# Patient Record
Sex: Female | Born: 1955 | Race: Black or African American | Hispanic: No | Marital: Single | Smoking: Never smoker
Health system: Southern US, Community
[De-identification: ages and names within clinical notes are randomized; demographics above are authoritative.]

## PROBLEM LIST (undated history)

## (undated) DIAGNOSIS — E039 Hypothyroidism, unspecified: Secondary | ICD-10-CM

## (undated) DIAGNOSIS — D869 Sarcoidosis, unspecified: Secondary | ICD-10-CM

## (undated) DIAGNOSIS — E785 Hyperlipidemia, unspecified: Secondary | ICD-10-CM

## (undated) DIAGNOSIS — F411 Generalized anxiety disorder: Secondary | ICD-10-CM

## (undated) DIAGNOSIS — F419 Anxiety disorder, unspecified: Secondary | ICD-10-CM

## (undated) DIAGNOSIS — I10 Essential (primary) hypertension: Secondary | ICD-10-CM

## (undated) DIAGNOSIS — M8580 Other specified disorders of bone density and structure, unspecified site: Secondary | ICD-10-CM

## (undated) DIAGNOSIS — G43909 Migraine, unspecified, not intractable, without status migrainosus: Secondary | ICD-10-CM

## (undated) DIAGNOSIS — J3089 Other allergic rhinitis: Secondary | ICD-10-CM

## (undated) HISTORY — DX: Other specified disorders of bone density and structure, unspecified site: M85.80

## (undated) HISTORY — DX: Other allergic rhinitis: J30.89

## (undated) HISTORY — DX: Hyperlipidemia, unspecified: E78.5

## (undated) HISTORY — DX: Essential (primary) hypertension: I10

## (undated) HISTORY — DX: Hypothyroidism, unspecified: E03.9

## (undated) HISTORY — DX: Anxiety disorder, unspecified: F41.9

## (undated) HISTORY — PX: THYROID SURGERY: SHX805

## (undated) HISTORY — DX: Migraine, unspecified, not intractable, without status migrainosus: G43.909

## (undated) HISTORY — DX: Sarcoidosis, unspecified: D86.9

## (undated) HISTORY — DX: Generalized anxiety disorder: F41.1

---
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
## 1985-08-24 HISTORY — PX: TUBAL LIGATION: SHX77

## 1999-01-07 ENCOUNTER — Other Ambulatory Visit: Admission: RE | Admit: 1999-01-07 | Discharge: 1999-01-07 | Payer: Self-pay | Admitting: Obstetrics and Gynecology

## 2000-05-05 ENCOUNTER — Other Ambulatory Visit: Admission: RE | Admit: 2000-05-05 | Discharge: 2000-05-05 | Payer: Self-pay | Admitting: Obstetrics & Gynecology

## 2000-05-18 ENCOUNTER — Ambulatory Visit (HOSPITAL_BASED_OUTPATIENT_CLINIC_OR_DEPARTMENT_OTHER): Admission: RE | Admit: 2000-05-18 | Discharge: 2000-05-18 | Payer: Self-pay | Admitting: Orthopedic Surgery

## 2001-08-03 ENCOUNTER — Other Ambulatory Visit: Admission: RE | Admit: 2001-08-03 | Discharge: 2001-08-03 | Payer: Self-pay | Admitting: Obstetrics & Gynecology

## 2002-07-28 ENCOUNTER — Other Ambulatory Visit: Admission: RE | Admit: 2002-07-28 | Discharge: 2002-07-28 | Payer: Self-pay | Admitting: Obstetrics and Gynecology

## 2003-08-13 ENCOUNTER — Encounter: Admission: RE | Admit: 2003-08-13 | Discharge: 2003-08-13 | Payer: Self-pay | Admitting: Dermatology

## 2003-09-21 ENCOUNTER — Encounter: Admission: RE | Admit: 2003-09-21 | Discharge: 2003-09-21 | Payer: Self-pay | Admitting: Internal Medicine

## 2003-11-13 ENCOUNTER — Ambulatory Visit (HOSPITAL_COMMUNITY): Admission: RE | Admit: 2003-11-13 | Discharge: 2003-11-13 | Payer: Self-pay | Admitting: Internal Medicine

## 2004-03-17 ENCOUNTER — Encounter: Admission: RE | Admit: 2004-03-17 | Discharge: 2004-03-17 | Payer: Self-pay | Admitting: Internal Medicine

## 2004-06-10 ENCOUNTER — Ambulatory Visit (HOSPITAL_COMMUNITY): Admission: RE | Admit: 2004-06-10 | Discharge: 2004-06-10 | Payer: Self-pay | Admitting: Internal Medicine

## 2004-11-24 ENCOUNTER — Ambulatory Visit: Payer: Self-pay | Admitting: Internal Medicine

## 2004-12-01 ENCOUNTER — Emergency Department (HOSPITAL_COMMUNITY): Admission: EM | Admit: 2004-12-01 | Discharge: 2004-12-01 | Payer: Self-pay | Admitting: Family Medicine

## 2004-12-22 ENCOUNTER — Ambulatory Visit: Payer: Self-pay | Admitting: Internal Medicine

## 2005-02-16 ENCOUNTER — Ambulatory Visit: Payer: Self-pay | Admitting: Internal Medicine

## 2005-05-19 ENCOUNTER — Ambulatory Visit: Payer: Self-pay | Admitting: Internal Medicine

## 2006-01-06 ENCOUNTER — Emergency Department (HOSPITAL_COMMUNITY): Admission: EM | Admit: 2006-01-06 | Discharge: 2006-01-06 | Payer: Self-pay | Admitting: Family Medicine

## 2006-04-14 ENCOUNTER — Ambulatory Visit: Payer: Self-pay | Admitting: Internal Medicine

## 2006-12-11 ENCOUNTER — Emergency Department (HOSPITAL_COMMUNITY): Admission: EM | Admit: 2006-12-11 | Discharge: 2006-12-11 | Payer: Self-pay | Admitting: Emergency Medicine

## 2007-07-30 ENCOUNTER — Emergency Department (HOSPITAL_COMMUNITY): Admission: EM | Admit: 2007-07-30 | Discharge: 2007-07-30 | Payer: Self-pay | Admitting: Family Medicine

## 2008-01-01 ENCOUNTER — Emergency Department (HOSPITAL_COMMUNITY): Admission: EM | Admit: 2008-01-01 | Discharge: 2008-01-01 | Payer: Self-pay | Admitting: Family Medicine

## 2008-01-27 ENCOUNTER — Encounter: Payer: Self-pay | Admitting: Internal Medicine

## 2008-03-01 ENCOUNTER — Encounter: Payer: Self-pay | Admitting: Internal Medicine

## 2008-03-08 ENCOUNTER — Encounter: Payer: Self-pay | Admitting: Internal Medicine

## 2008-03-14 DIAGNOSIS — J302 Other seasonal allergic rhinitis: Secondary | ICD-10-CM

## 2008-03-14 DIAGNOSIS — J3089 Other allergic rhinitis: Secondary | ICD-10-CM

## 2008-03-14 DIAGNOSIS — D869 Sarcoidosis, unspecified: Secondary | ICD-10-CM

## 2008-03-14 HISTORY — DX: Other seasonal allergic rhinitis: J30.2

## 2008-03-15 ENCOUNTER — Ambulatory Visit: Payer: Self-pay | Admitting: Internal Medicine

## 2008-03-15 DIAGNOSIS — M7918 Myalgia, other site: Secondary | ICD-10-CM

## 2008-03-15 DIAGNOSIS — R29818 Other symptoms and signs involving the nervous system: Secondary | ICD-10-CM | POA: Insufficient documentation

## 2008-03-15 HISTORY — DX: Myalgia, other site: M79.18

## 2008-03-15 LAB — CONVERTED CEMR LAB: Angiotensin 1 Converting Enzyme: 18 units/L (ref 9–67)

## 2008-04-17 ENCOUNTER — Ambulatory Visit: Payer: Self-pay | Admitting: Internal Medicine

## 2008-09-13 ENCOUNTER — Telehealth: Payer: Self-pay | Admitting: Internal Medicine

## 2008-11-19 ENCOUNTER — Ambulatory Visit: Payer: Self-pay | Admitting: Internal Medicine

## 2009-02-05 ENCOUNTER — Telehealth (INDEPENDENT_AMBULATORY_CARE_PROVIDER_SITE_OTHER): Payer: Self-pay | Admitting: *Deleted

## 2010-06-22 ENCOUNTER — Emergency Department (HOSPITAL_COMMUNITY): Admission: EM | Admit: 2010-06-22 | Discharge: 2010-06-22 | Payer: Self-pay | Admitting: Family Medicine

## 2010-06-27 ENCOUNTER — Telehealth: Payer: Self-pay | Admitting: Internal Medicine

## 2010-09-23 NOTE — Progress Notes (Signed)
Summary: sick-requesting anitbiotic  Phone Note Call from Patient Call back at Work Phone [phone]   Caller: Patient Call For: young Reason for Call: Acute Illness, Talk to Nurse Summary of Call: Patient called with c/o horseness, tightness in chest, non-productive cough that is worse at night, and increased sob when coughing.  Pt states symptoms started 1 week ago.  [facility] [place]-- [phone] Initial call taken by: Lehman Prom,  June 27, 2010 8:31 AM  Follow-up for Phone Call        called spoke with patient who c/o tightness in chest, dry cough mostly at ngiht, some PND w/ horseness and increased SOB x1 week.  denies f/c/s.  states is going to [place] on the 14th.  requesting abx.  last ov 3.2010, no upcoming appts.  will sched pt for follow up. nkda.  [facility] [place] Follow-up by: Boone Master CNA/MA,  June 27, 2010 9:20 AM  Additional Follow-up for Phone Call Additional follow up Details #1::        Per CDY- give patient Zpak #1 take as directed no refills.Reynaldo Minium CMA  June 27, 2010 10:51 AM   [facility] [place] pt aware.Carron Curie CMA  June 27, 2010 11:04 AM     Prescriptions: ZITHROMAX Z-PAK 250 MG TABS (AZITHROMYCIN) 2 today then one daily  #1 pak x 0   Entered by:   Carron Curie CMA   Authorized by:   Waymon Budge MD   Signed by:   Carron Curie CMA on 06/27/2010   Method used:   Historical   RxID:   [id number]

## 2011-01-09 NOTE — Assessment & Plan Note (Signed)
[facility]                               PULMONARY OFFICE NOTE   Mary Taylor, Mary Taylor                       MRN:          [id number]  DATE:04/14/2006                            DOB:          1955/12/16    PROBLEM LIST:  1. Sarcoidosis, lung and skin.  2. Perennial rhinitis.   HISTORY:  One year followup.  She says she is doing very well.  She denies  rash, adenopathy, fever, significant joint pain, chest complaints or any  acute change.  She walks regularly in her neighborhood with no change in  exercise tolerance.  She continues to notice occasional nasal congestion,  but without itch or sneeze.  Rhinocort worked for this, but Hydrologist  did not.   MEDICATIONS:  Synthroid, nasal steroid inhaler when available, occasional  Xanax p.r.n.Marland Kitchen   ALLERGIES:  No medication allergies.   OBJECTIVE:  VITAL SIGNS:  Weight 156 pounds.  BP 138/80.  Pulse regular and  76.  Room air saturation 100%.  GENERAL  This is a slender woman, looks comfortable.  NECK:  I find no adenopathy.  CARDIAC:  Heart sounds are regular without murmur or gallop.  EYES:  Conjunctivae are clear.  NOSE:  Nasal mucosa is mildly boggy without postnasal drainage.  THROAT:  There is no stridor.  Voice quality is normal.  LUNGS:  Clear to P&A.  ABDOMEN:  I do not feel enlargement of liver or spleen.  There is no sign of  cyst or edema.   IMPRESSION:  Stable sarcoid, probably inactive.  Perennial rhinitis, which  improves with regular use of a nasal steroid inhaler.   PLAN:  1. Try sample prescription of Nasonex 1 to each nostril daily.  2. Chest x-ray.  3. Schedule return 1-year followup, earlier p.r.n.Joni Fears D. Maple Hudson, MD, [facility], FACP   CDY/MedQ  DD:  04/14/2006  DT:  04/15/2006  Job #:  [id number]   cc:   Darius Bump, MD

## 2011-01-09 NOTE — Op Note (Signed)
[place]. [facility]  Patient:    Mary Taylor, Mary Taylor                       MRN: [id number] Proc. Date: 05/18/00 Adm. Date:  [id number] Attending:  Susa Day                           Operative Report  PREOPERATIVE DIAGNOSIS:  Interarticular fracture left small finger, middle phalanx, at the interphalangeal joint.  POSTOPERATIVE DIAGNOSIS: Interarticular fracture left small finger, middle phalanx, at the interphalangeal joint.  PROCEDURE:  Closed reduction and 0.025 inch Kirschner wire fixation of radial condyle fracture of left small finger, middle phalanx fracture, at distal interphalangeal joint.  SURGEON:  Katy Fitch. Sypher, Montez Hageman., M.D.  ASSISTANT:  None.  ANESTHESIA:  0.25% Marcaine with 2% lidocaine metacarpal head level block of left small finger supplemented by IV sedation.  ANESTHESIOLOGIST:  Halford Decamp, M.D.  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  She had immediate pain, swelling, and deformity.  She was seen in urgent medical care center by Dr. Merla Riches.  Dr. Merla Riches obtained a x-ray which revealed a condylar fracture of the middle phalanx at the DIP joint.  He splinted her and referred her for hand surgery consult.  She was seen for consultation on May 17, 2000, and was noted to have an interarticular fracture.  She had a radial deviation deformity of her DIP joint and could not move the joint due to pain.  She was advised to undergo closed reduction due to the interarticular nature of the fracture and percutaneous Kirschner wire fixation.  PROCEDURE IN DETAIL:   After informed consent she was brought to the operating room at this time.  Daquana Paddock was brought to the operating room and placed in supine position on  the operating room table.  Following light sedation the left arm was prepped with Betadine soap solution, and sterilely draped.  Then 0.25% Marcaine and 2% lidocaine were infiltrated at the metacarpal head level to obtain a digital block.  When anesthesia was satisfactory the fracture was reduced anatomically with ulnar deviation and digital compression.  A rubber-shod hemostat was used to hold this followed by placement of two 0.035 inch Kirschner wires through the main fragment into the fragment from a ulnar approach.  An anatomic reduction was achieved and good position of the pins was obtained.  C-arm fluoroscopic images were documented with digital photography followed by dressing the wound with collodion to seal the pin tracks and applying a aluminum splint to the finger.  There were no apparent complications.  Mary Taylor tolerated the surgery and anesthesia well.  She was transferred to the recovery room with stable vital signs.  For her aftercare she will be given a prescription for Percocet 5/325 1-2 tablets p.o. q.4-6h. p.r.n. pain #20 tablets without refill.  She will return to my office for follow in 1 week for check x-ray.  She and her husband were advised to elevate the hand for 48 hours. DD:  05/18/00 TD:  05/19/00 Job: [id number]

## 2011-06-01 LAB — URINE CULTURE
Colony Count: NO GROWTH
Culture: NO GROWTH

## 2011-06-01 LAB — POCT URINALYSIS DIP (DEVICE)
Glucose, UA: NEGATIVE
Nitrite: POSITIVE — AB
Operator id: 239701
Protein, ur: 100 — AB
Specific Gravity, Urine: 1.015
Urobilinogen, UA: 1
pH: 7

## 2012-07-07 ENCOUNTER — Other Ambulatory Visit: Payer: Self-pay | Admitting: Family Medicine

## 2012-07-07 DIAGNOSIS — E010 Iodine-deficiency related diffuse (endemic) goiter: Secondary | ICD-10-CM

## 2012-07-20 ENCOUNTER — Ambulatory Visit
Admission: RE | Admit: 2012-07-20 | Discharge: 2012-07-20 | Disposition: A | Discharge status: Home / Self Care | Payer: 59 | Source: Ambulatory Visit | Attending: Family Medicine | Admitting: Family Medicine

## 2012-07-20 DIAGNOSIS — E010 Iodine-deficiency related diffuse (endemic) goiter: Secondary | ICD-10-CM

## 2012-08-23 ENCOUNTER — Institutional Professional Consult (permissible substitution): Payer: Self-pay | Admitting: Internal Medicine

## 2012-10-10 ENCOUNTER — Ambulatory Visit (INDEPENDENT_AMBULATORY_CARE_PROVIDER_SITE_OTHER)
Admission: RE | Admit: 2012-10-10 | Discharge: 2012-10-10 | Disposition: A | Discharge status: Home / Self Care | Payer: BC Managed Care – PPO | Source: Ambulatory Visit | Attending: Internal Medicine | Admitting: Internal Medicine

## 2012-10-10 ENCOUNTER — Other Ambulatory Visit: Payer: BC Managed Care – PPO

## 2012-10-10 ENCOUNTER — Ambulatory Visit (INDEPENDENT_AMBULATORY_CARE_PROVIDER_SITE_OTHER): Payer: BC Managed Care – PPO | Admitting: Internal Medicine

## 2012-10-10 ENCOUNTER — Encounter: Payer: Self-pay | Admitting: Internal Medicine

## 2012-10-10 VITALS — BP 138/78 | HR 73 | Ht 67.0 in | Wt 147.2 lb

## 2012-10-10 DIAGNOSIS — J302 Other seasonal allergic rhinitis: Secondary | ICD-10-CM

## 2012-10-10 DIAGNOSIS — D869 Sarcoidosis, unspecified: Secondary | ICD-10-CM

## 2012-10-10 DIAGNOSIS — J309 Allergic rhinitis, unspecified: Secondary | ICD-10-CM

## 2012-10-10 NOTE — Progress Notes (Signed)
10/10/12- 57 yoF never smoker, former patient-Self referral-allergic rhinitis and sarcoidosis; last seen 11-19-08 She decided it was time to come back for a checkup, admitting that her brother just died last week of hypoxic respiratory failure caused by sarcoid. She chose not to get flu shot this year. History of sarcoid diagnosed with a biopsy from her knee years ago. Went to urgent care for recent cough with laryngitis. Was treated with steroid shot and antibiotic with benefit. History of allergic rhinitis with no routine cough or wheeze. No routine fever, nodes, night sweats, rash or cough.  Divorced mother of a 58 year old, working as a Armed forces training and education officer.  Prior to Admission medications   Medication Sig Start Date End Date Taking? Authorizing Provider  cholecalciferol (VITAMIN D) 1000 UNITS tablet Take 1,000 Units by mouth daily.   Yes Historical Provider, MD  lisinopril (PRINIVIL,ZESTRIL) 10 MG tablet Take 1 tablet by mouth daily. 07/09/12  Yes Historical Provider, MD  SYNTHROID 75 MCG tablet Take 1 tablet by mouth daily. 09/27/12  Yes Historical Provider, MD   Past Medical History  Diagnosis Date  . Hyperlipidemia   . Hypertension    Past Surgical History  Procedure Laterality Date  . Tubal ligation  1987  . Breast cyst excision  1970  . Thyroid surgery      goiter   History reviewed. No pertinent family history. History   Social History  . Marital Status: Single    Spouse Name: N/A    Number of Children: N/A  . Years of Education: N/A   Occupational History  . maint. tech    Social History Main Topics  . Smoking status: Never Smoker   . Smokeless tobacco: Not on file  . Alcohol Use: No  . Drug Use: No  . Sexually Active: Not on file   Other Topics Concern  . Not on file   Social History Narrative  . No narrative on file   ROS-see HPI Constitutional:   No-   weight loss, night sweats, fevers, chills, fatigue, lassitude. HEENT:   No-  headaches, difficulty  swallowing, tooth/dental problems, sore throat,       No-  sneezing, itching, ear ache, nasal congestion, post nasal drip,  CV:  No-   chest pain, orthopnea, PND, swelling in lower extremities, anasarca,                                  dizziness, palpitations Resp: No-   shortness of breath with exertion or at rest.              No-   productive cough,  No non-productive cough,  No- coughing up of blood.              No-   change in color of mucus.  No- wheezing.   Skin: No-   rash or lesions. GI:  No-   heartburn, indigestion, abdominal pain, nausea, vomiting, diarrhea,                 change in bowel habits, loss of appetite GU: No-   dysuria, change in color of urine, no urgency or frequency.  No- flank pain. MS:  No-   joint pain or swelling.  No- decreased range of motion.  No- back pain. Neuro-     nothing unusual Psych:  No- change in mood or affect. No depression or anxiety.  No memory loss.  OBJ- Physical  Exam General- Alert, Oriented, Affect-appropriate, Distress- none acute, trim. Skin- rash-none, lesions- none, excoriation- none Lymphadenopathy- none Head- atraumatic            Eyes- Gross vision intact, PERRLA, conjunctivae and secretions clear            Ears- Hearing, canals-normal            Nose- Clear, no-Septal dev, mucus, polyps, erosion, perforation             Throat- Mallampati II , mucosa clear , drainage- none, tonsils- atrophic Neck- flexible , trachea midline, no stridor , thyroid nl, carotid no bruit Chest - symmetrical excursion , unlabored           Heart/CV- RRR , no murmur , no gallop  , no rub, nl s1 s2                           - JVD- none , edema- none, stasis changes- none, varices- none           Lung- clear to P&A, wheeze- none, cough- none , dullness-none, rub- none           Chest wall-  Abd- tender-no, distended-no, bowel sounds-present, HSM- no Br/ Gen/ Rectal- Not done, not indicated Extrem- cyanosis- none, clubbing, none, atrophy- none,  strength- nl Neuro- grossly intact to observation

## 2012-10-10 NOTE — Assessment & Plan Note (Signed)
No major markers of active disease. Hopefully this is in long-term remission. Plan-ACE level, chest x-ray

## 2012-10-10 NOTE — Assessment & Plan Note (Signed)
She has recognized some rhinitis symptoms separate from the recent cold. This has been a complaint in the past but is not bothering her much right now. Consider antihistamines as first line of defense

## 2012-10-10 NOTE — Patient Instructions (Addendum)
Order- CXR   Dx sarcoidosis              Lab- ACE level  Please call as needed

## 2012-10-11 LAB — ANGIOTENSIN CONVERTING ENZYME: Angiotensin-Converting Enzyme: 4 U/L — ABNORMAL LOW (ref 8–52)

## 2012-11-14 ENCOUNTER — Ambulatory Visit: Payer: BC Managed Care – PPO | Admitting: Internal Medicine

## 2014-04-07 ENCOUNTER — Encounter: Payer: Self-pay | Admitting: *Deleted

## 2014-05-11 ENCOUNTER — Telehealth: Payer: Self-pay | Admitting: Internal Medicine

## 2014-05-11 MED ORDER — AZITHROMYCIN 250 MG PO TABS: ORAL_TABLET | ORAL | Status: DC

## 2014-05-11 NOTE — Telephone Encounter (Signed)
Called spoke with patient who c/o head congestion, PND onset Monday 9/14 but beginning Wednesday 9/16 she began having prod cough with dark yellow/green mucus, tightness in chest thru back and hoarseness/larygnitis and increased DOE today.  Pt denies any hemoptysis, f/c/s, n/v/d.  Her last appt with CDY was 2.17.14 but has scheduled an upcoming appt on 9.29.15 with said provider.  Dr Maple Hudson please advise, thank you. [facility] on [address] Allergies  Allergen Reactions  . Ibuprofen     Stomach pains  . Lexapro [Escitalopram Oxalate]     Hoarse   Current Outpatient Prescriptions on File Prior to Visit  Medication Sig Dispense Refill  . ALPRAZolam (XANAX) 0.25 MG tablet Take 0.25 mg by mouth at bedtime as needed for anxiety.      . cholecalciferol (VITAMIN D) 1000 UNITS tablet Take 1,000 Units by mouth daily.      . Cyanocobalamin (VITAMIN B 12 PO) Take by mouth as needed.      . cyclobenzaprine (FLEXERIL) 10 MG tablet Take 10 mg by mouth as needed for muscle spasms.      Marland Kitchen lisinopril (PRINIVIL,ZESTRIL) 10 MG tablet Take 1 tablet by mouth daily.      Marland Kitchen SYNTHROID 75 MCG tablet Take 1 tablet by mouth daily.       No current facility-administered medications on file prior to visit.

## 2014-05-11 NOTE — Telephone Encounter (Signed)
Offer Z pak May help to also get Mucinex-DM

## 2014-05-11 NOTE — Telephone Encounter (Signed)
LMOMTCB x 1 

## 2014-05-11 NOTE — Telephone Encounter (Signed)
Spoke with the pt and notified of recs per CDY  She verbalized understanding and denies any questions  Rx [facility] to [facility]

## 2014-05-22 ENCOUNTER — Ambulatory Visit (INDEPENDENT_AMBULATORY_CARE_PROVIDER_SITE_OTHER): Payer: BC Managed Care – PPO | Admitting: Internal Medicine

## 2014-05-22 ENCOUNTER — Encounter: Payer: Self-pay | Admitting: Internal Medicine

## 2014-05-22 VITALS — BP 122/66 | HR 71 | Resp 16 | Ht 67.0 in | Wt 150.0 lb

## 2014-05-22 DIAGNOSIS — D869 Sarcoidosis, unspecified: Secondary | ICD-10-CM

## 2014-05-22 DIAGNOSIS — J069 Acute upper respiratory infection, unspecified: Secondary | ICD-10-CM

## 2014-05-22 HISTORY — DX: Acute upper respiratory infection, unspecified: J06.9

## 2014-05-22 NOTE — Patient Instructions (Signed)
Chest xray and lab test from last year looked fine, with no evidence of active sarcoid. It is unlikely to flare again.  The recent cold should fade away.   Please call if we can help.

## 2014-05-22 NOTE — Assessment & Plan Note (Signed)
Probably resolving viral URI. Plan- fluids, avoid chilling

## 2014-05-22 NOTE — Assessment & Plan Note (Signed)
Clinically in remission 

## 2014-05-22 NOTE — Progress Notes (Signed)
10/10/12- 28 yoF never smoker, former patient-Self referral-allergic rhinitis and sarcoidosis; last seen 11-19-08 She decided it was time to come back for a checkup, admitting that her brother just died last week of hypoxic respiratory failure caused by sarcoid. She chose not to get flu shot this year. History of sarcoid diagnosed with a biopsy from her knee years ago. Went to urgent care for recent cough with laryngitis. Was treated with steroid shot and antibiotic with benefit. History of allergic rhinitis with no routine cough or wheeze. No routine fever, nodes, night sweats, rash or cough.  Divorced mother of a 44 year old, working as a Armed forces training and education officer.  05/22/14- 50 yoF never smoker, former patient-Self referral-allergic rhinitis and sarcoidosis Had been doing very well. No rash, nodes, cough, chest pain to suggest sarcoid. Acute illness- URI caught at work in last week. Scratchy throat, no fever, had fever blister. We sent Zpak and she is doing much better. ACE 4   10/10/12 CXR 10/10/12 IMPRESSION:  No active disease. Stable probable granulomas right lower lobe.  Original Report Authenticated By: Natasha Mead, M.D.  ROS-see HPI Constitutional:   No-   weight loss, night sweats, fevers, chills, fatigue, lassitude. HEENT:   No-  headaches, difficulty swallowing, tooth/dental problems, sore throat,       No-  sneezing, itching, ear ache, nasal congestion, post nasal drip,  CV:  No-   chest pain, orthopnea, PND, swelling in lower extremities, anasarca,                                  dizziness, palpitations Resp: No-   shortness of breath with exertion or at rest.              No-   productive cough,  N non-productive cough,  No- coughing up of blood.              No-   change in color of mucus.  No- wheezing.   Skin: No-   rash or lesions. GI:  No-   heartburn, indigestion, abdominal pain, nausea, vomiting GU:  MS:  No-   joint pain or swelling.  . Neuro-     nothing unusual Psych:   No- change in mood or affect. No depression or anxiety.  No memory loss.  OBJ- Physical Exam General- Alert, Oriented, Affect-appropriate, Distress- none acute, trim. Skin- rash-none, lesions- none, excoriation- none Lymphadenopathy- none Head- atraumatic            Eyes- Gross vision intact, PERRLA, conjunctivae and secretions clear            Ears- Hearing, canals-normal            Nose- Clear, no-Septal dev, mucus, polyps, erosion, perforation             Throat- Mallampati II , mucosa clear , drainage- none, tonsils- atrophic Neck- flexible , trachea midline, no stridor , thyroid nl, carotid no bruit Chest - symmetrical excursion , unlabored           Heart/CV- RRR , no murmur , no gallop  , no rub, nl s1 s2                           - JVD- none , edema- none, stasis changes- none, varices- none           Lung- clear to P&A, wheeze- none, cough- none ,  dullness-none, rub- none, +light throat                            clearing           Chest wall-  Abd-  Br/ Gen/ Rectal- Not done, not indicated Extrem- cyanosis- none, clubbing, none, atrophy- none, strength- nl Neuro- grossly intact to observation

## 2015-07-24 ENCOUNTER — Telehealth: Payer: Self-pay | Admitting: Internal Medicine

## 2015-07-24 NOTE — Telephone Encounter (Signed)
LMTCB x 1 

## 2015-07-24 NOTE — Telephone Encounter (Signed)
Called and spoke to pt. Pt stated she thinks she is at the beginning of loosing her voice and didn't know if she should come in for an appt or CY should send in an rx. Pt was last seen in 04/2014 and was advised to f/u prn. Advised pt she could call her PCP to get an appt but if there are issues and she cannot get in to see PCP or she worsens then to call us back. Pt was not hoarse at the moment and denied any other symptoms. Pt stated she will call her PCP and call back here if needed. Nothing further needed at this time.

## 2015-07-24 NOTE — Telephone Encounter (Signed)
Patient returned call, may be reached at [phone]

## 2016-01-14 ENCOUNTER — Ambulatory Visit
Admission: RE | Admit: 2016-01-14 | Discharge: 2016-01-14 | Disposition: A | Discharge status: Home / Self Care | Payer: BLUE CROSS/BLUE SHIELD | Source: Ambulatory Visit | Attending: Family Medicine | Admitting: Family Medicine

## 2016-01-14 ENCOUNTER — Other Ambulatory Visit: Payer: Self-pay | Admitting: Family Medicine

## 2016-01-14 DIAGNOSIS — R079 Chest pain, unspecified: Secondary | ICD-10-CM

## 2016-01-14 DIAGNOSIS — R0781 Pleurodynia: Secondary | ICD-10-CM

## 2016-01-16 ENCOUNTER — Telehealth: Payer: Self-pay | Admitting: Internal Medicine

## 2016-01-16 NOTE — Telephone Encounter (Signed)
Appointment date is fine. Thanks.

## 2016-01-21 ENCOUNTER — Ambulatory Visit (INDEPENDENT_AMBULATORY_CARE_PROVIDER_SITE_OTHER): Payer: BLUE CROSS/BLUE SHIELD | Admitting: Adult Health

## 2016-01-21 ENCOUNTER — Other Ambulatory Visit: Payer: BLUE CROSS/BLUE SHIELD

## 2016-01-21 ENCOUNTER — Encounter: Payer: Self-pay | Admitting: Adult Health

## 2016-01-21 VITALS — BP 136/88 | HR 80 | Temp 98.3°F | Ht 67.0 in | Wt 147.0 lb

## 2016-01-21 DIAGNOSIS — D869 Sarcoidosis, unspecified: Secondary | ICD-10-CM

## 2016-01-21 NOTE — Patient Instructions (Signed)
Begin Delsym 2 tsp Twice daily  As needed cough .  May use Zyrtec 10mg  At bedtime  As needed for drainage .  Discuss with your primary MD that Lisinopril may be causing your cough to be worse.  Labs today .  follow up Dr. Maple HudsonYoung  In 6-8 weeks with PFT .  Please contact office for sooner follow up if symptoms do not improve or worsen or seek emergency care

## 2016-01-21 NOTE — Addendum Note (Signed)
Addended by: Karalee HeightOX, Kimia Finan P on: 01/21/2016 05:06 PM   Modules accepted: Orders

## 2016-01-21 NOTE — Progress Notes (Signed)
Subjective:    Patient ID: Mary Taylor, female    DOB: Nov 28, 1955, 60 y.o.   MRN: [id number]  HPI [age] year old female never smoker followed for allergic rhinitis and sarcoidosis with pulmonary and skin involvement. Diagnosis from previous skin lesion.   01/21/2016 follow-up sarcoid  patient returns for follow-up for sarcoid. She was last seen in September 2015. Patient complains over the last 6 weeks that she has had a persistent dry cough. She was seen at urgent care and given a prednisone taper. He did not see any significant improvement. He denies any fever, orthopnea, PND, leg swelling, rash, hemoptysis, weight loss.  Patient was seen by her primary care doctor last week. Chest x-ray showed no significant change in hilar adenopathy. She is not using anything for cough.. Says that she has used Zyrtec for intermittent drainage. But does not feel like at this is a huge problem .  She denies any reflux.     Past Medical History  Diagnosis Date  . Hyperlipidemia   . Hypertension   . Sarcoidosis (HCC)   . Hypothyroidism   . Perennial allergic rhinitis   . Migraine   . Anxiety disorder     situational  . Osteopenia    Current Outpatient Prescriptions on File Prior to Visit  Medication Sig Dispense Refill  . ALPRAZolam (XANAX) 0.25 MG tablet Take 0.25 mg by mouth at bedtime as needed for anxiety.    . cholecalciferol (VITAMIN D) 1000 UNITS tablet Take 1,000 Units by mouth daily.    . Cyanocobalamin (VITAMIN B 12 PO) Take by mouth as needed.    . cyclobenzaprine (FLEXERIL) 10 MG tablet Take 10 mg by mouth as needed for muscle spasms.    Marland Kitchen. lisinopril (PRINIVIL,ZESTRIL) 10 MG tablet Take 1 tablet by mouth daily.    Marland Kitchen. SYNTHROID 75 MCG tablet Take 1 tablet by mouth daily.     No current facility-administered medications on file prior to visit.      Review of Systems Constitutional:   No  weight loss, night sweats,  Fevers, chills, fatigue, or  lassitude.  HEENT:   No  headaches,  Difficulty swallowing,  Tooth/dental problems, or  Sore throat,                No sneezing, itching, ear ache, nasal congestion, post nasal drip,   CV:  No chest pain,  Orthopnea, PND, swelling in lower extremities, anasarca, dizziness, palpitations, syncope.   GI  No heartburn, indigestion, abdominal pain, nausea, vomiting, diarrhea, change in bowel habits, loss of appetite, bloody stools.   Resp: No shortness of breath with exertion or at rest.  No excess mucus, no productive cough,  No non-productive cough,  No coughing up of blood.  No change in color of mucus.  No wheezing.  No chest wall deformity  Skin: no rash or lesions.  GU: no dysuria, change in color of urine, no urgency or frequency.  No flank pain, no hematuria   MS:  No joint pain or swelling.  No decreased range of motion.  No back pain.  Psych:  No change in mood or affect. No depression or anxiety.  No memory loss.         Objective:   Physical Exam Filed Vitals:   01/21/16 1618  BP: 136/88  Pulse: 80  Temp: 98.3 F (36.8 C)  TempSrc: Oral  Height: 5\' 7"  (1.702 m)  Weight: 147 lb (66.679 kg)  SpO2: 99%    GEN: A/Ox3; pleasant ,  NAD, well nourished   HEENT:  [place]/AT,  EACs-clear, TMs-wnl, NOSE-clear, THROAT-clear, no lesions, no postnasal drip or exudate noted.   NECK:  Supple w/ fair ROM; no JVD; normal carotid impulses w/o bruits; no thyromegaly or nodules palpated; no lymphadenopathy.  RESP  Clear  P & A; w/o, wheezes/ rales/ or rhonchi.no accessory muscle use, no dullness to percussion  CARD:  RRR, no m/r/g  , no peripheral edema, pulses intact, no cyanosis or clubbing.  GI:   Soft & nt; nml bowel sounds; no organomegaly or masses detected.  Musco: Warm bil, no deformities or joint swelling noted.   Neuro: alert, no focal deficits noted.    Skin: Warm, no lesions or rashes  Tammy Parrett NP-C  [facility]  01/21/2016        Assessment & Plan:

## 2016-01-21 NOTE — Assessment & Plan Note (Signed)
?   Flare although did not improve on steroids , cxr w/out change.  Will check ACE level . Get PFT on return  Suspect ACE inhibitor is aggravating her coughing .   Plan  Begin Delsym 2 tsp Twice daily  As needed cough .  May use Zyrtec 10mg  At bedtime  As needed for drainage .  Discuss with your primary MD that Lisinopril may be causing your cough to be worse.  Labs today .  Follow up Dr. Maple HudsonYoung  In 6-8 weeks with PFT .  Please contact office for sooner follow up if symptoms do not improve or worsen or seek emergency care

## 2016-01-22 LAB — ANGIOTENSIN CONVERTING ENZYME: Angiotensin-Converting Enzyme: 6 U/L — ABNORMAL LOW (ref 8–52)

## 2016-01-22 NOTE — Progress Notes (Signed)
Quick Note:  Called spoke with pt. Reviewed results and recs. Pt voiced understanding and had no further questions. ______ 

## 2016-02-24 ENCOUNTER — Other Ambulatory Visit: Payer: Self-pay | Admitting: Physician Assistant

## 2016-02-24 DIAGNOSIS — R1012 Left upper quadrant pain: Secondary | ICD-10-CM

## 2016-02-24 DIAGNOSIS — K760 Fatty (change of) liver, not elsewhere classified: Secondary | ICD-10-CM

## 2016-02-24 DIAGNOSIS — R0989 Other specified symptoms and signs involving the circulatory and respiratory systems: Secondary | ICD-10-CM

## 2016-03-02 ENCOUNTER — Ambulatory Visit
Admission: RE | Admit: 2016-03-02 | Discharge: 2016-03-02 | Disposition: A | Discharge status: Home / Self Care | Payer: BLUE CROSS/BLUE SHIELD | Source: Ambulatory Visit | Attending: Physician Assistant | Admitting: Physician Assistant

## 2016-03-02 DIAGNOSIS — K760 Fatty (change of) liver, not elsewhere classified: Secondary | ICD-10-CM

## 2016-03-02 DIAGNOSIS — R0989 Other specified symptoms and signs involving the circulatory and respiratory systems: Secondary | ICD-10-CM

## 2016-03-02 DIAGNOSIS — R1012 Left upper quadrant pain: Secondary | ICD-10-CM

## 2016-12-18 ENCOUNTER — Encounter: Payer: Self-pay | Admitting: Internal Medicine

## 2016-12-18 ENCOUNTER — Ambulatory Visit (INDEPENDENT_AMBULATORY_CARE_PROVIDER_SITE_OTHER): Payer: BLUE CROSS/BLUE SHIELD | Admitting: Internal Medicine

## 2016-12-18 VITALS — BP 138/88 | HR 87 | Ht 67.0 in | Wt 150.4 lb

## 2016-12-18 DIAGNOSIS — J069 Acute upper respiratory infection, unspecified: Secondary | ICD-10-CM

## 2016-12-18 DIAGNOSIS — H9313 Tinnitus, bilateral: Secondary | ICD-10-CM | POA: Diagnosis not present

## 2016-12-18 DIAGNOSIS — D869 Sarcoidosis, unspecified: Secondary | ICD-10-CM | POA: Diagnosis not present

## 2016-12-18 DIAGNOSIS — J209 Acute bronchitis, unspecified: Secondary | ICD-10-CM | POA: Diagnosis not present

## 2016-12-18 HISTORY — DX: Tinnitus, bilateral: H93.13

## 2016-12-18 MED ORDER — METHYLPREDNISOLONE ACETATE 80 MG/ML IJ SUSP
80.0000 mg | Freq: Once | INTRAMUSCULAR | Status: AC
  Administered 2016-12-18: 80 mg via INTRAMUSCULAR

## 2016-12-18 MED ORDER — LEVALBUTEROL HCL 0.63 MG/3ML IN NEBU
0.6300 mg | INHALATION_SOLUTION | Freq: Once | RESPIRATORY_TRACT | Status: AC
  Administered 2016-12-18: 0.63 mg via RESPIRATORY_TRACT

## 2016-12-18 NOTE — Assessment & Plan Note (Signed)
ACE levels have been normal back at least to 2014. There's been no evidence of systemic activity. Chest x-ray shows residual hilar adenopathy which is unlikely to change. I do not believe current active respiratory symptoms are related to sarcoid. This disorder is likely burned-out and in chronic remission.

## 2016-12-18 NOTE — Assessment & Plan Note (Signed)
She has been aware of tinnitus for many months. Getting worse. Not specifically aware of hearing loss or vertigo. 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 She would like referral for evaluation and would prefer to be seen in [place].

## 2016-12-18 NOTE — Patient Instructions (Signed)
Order- office spirometry   Dx sarcoid  Order- neb xop 0.63     Dx acute bronchitis               Depo 55   Order- referral to ENT  Dr Ermalinda Barrios      Dx tinnitus  Please call as needed

## 2016-12-18 NOTE — Assessment & Plan Note (Addendum)
Viral or allergic rhinitis and tracheitis. This is not an exacerbation of her sarcoid. Plan- nebulizer treatments Xopenex, Depo-Medrol. OTC symptom therapies as she finds helpful.

## 2016-12-18 NOTE — Progress Notes (Signed)
HPI Female never smoker followed for allergic rhinitis, sarcoid - lung and skin, dx'd from skin bx Brother had died of hypoxic respiratory failure due to sarcoid ACE 4   10/10/12 [place] abd 03/02/16- no HSM ACE 5/301/7- 6 WNL (8-52)  ------------------------------------------------------------------------------  05/22/14- 56 yoF never smoker, former patient-Self referral-allergic rhinitis and sarcoidosis Had been doing very well. No rash, nodes, cough, chest pain to suggest sarcoid. Acute illness- URI caught at work in last week. Scratchy throat, no fever, had fever blister. We sent Zpak and she is doing much better. ACE 4   10/10/12 CXR 10/10/12 IMPRESSION:  No active disease. Stable probable granulomas right lower lobe.  Original Report Authenticated By: Natasha Mead, M.D.  12/18/16- 60 yoF followed for allergic rhinitis, sarcoid- lung and skin LOV 01/21/16-NP- S4 PFT on return -did not return [place] abd 03/02/16- no HSM ACE 5/301/7- 6 WNL (8-52) CXR 01/14/16- IMPRESSION: No change in nodular hila consistent with adenopathy in this patient with sarcoidosis. No active process. FOLLOWS FOR: Pt states her breathing has been good until recently-having chest congestion and unable to clear chest/lungs. Ringing in both ears-Right is worse. Pt not on any daily antihistamines. She had similar complaints in early pollen season last year. One month ago she had gone to urgent care-given penicillin 10 days for burning pain in nose, chest congestion and tightness. Chronic tinnitus with no vertigo. Scant phlegm-nothing comes up. Denies wheezing, fever, rash or adenopathy. Office Spirometry 12/18/16-WNL-FVC 3.07/103%, FEV1 2.40/102%, ratio 0.78, FEF 25-75% 2.40/106%  ROS-see HPI Constitutional:   No-   weight loss, night sweats, fevers, chills, fatigue, lassitude. HEENT:   No-  headaches, difficulty swallowing, tooth/dental problems, sore throat,       No-  sneezing, itching, ear ache, +nasal congestion, post nasal  drip,  CV:  No-   chest pain, orthopnea, PND, swelling in lower extremities, anasarca,                                                       dizziness, palpitations Resp: + shortness of breath with exertion or at rest.              No-   productive cough,  + non-productive cough,  No- coughing up of blood.              No-   change in color of mucus.  No- wheezing.   Skin: No-   rash or lesions. GI:  No-   heartburn, indigestion, abdominal pain, nausea, vomiting GU:  MS:  No-   joint pain or swelling.  . Neuro-     nothing unusual Psych:  No- change in mood or affect. No depression or anxiety.  No memory loss.  OBJ- Physical Exam General- Alert, Oriented, Affect-appropriate, Distress- none acute, + slender Skin- rash-none, lesions- none, excoriation- none Lymphadenopathy- none Head- atraumatic            Eyes- Gross vision intact, PERRLA, conjunctivae and secretions clear            Ears- Hearing, canals-normal            Nose- Clear, no-Septal dev, mucus, polyps, erosion, perforation             Throat- Mallampati II , mucosa clear , drainage- none, tonsils- atrophic Neck- flexible , trachea midline, no stridor ,  thyroid nl, carotid no bruit Chest - symmetrical excursion , unlabored           Heart/CV- RRR , no murmur , no gallop  , no rub, nl s1 s2                           - JVD- none , edema- none, stasis changes- none, varices- none           Lung- clear to P&A, wheeze- none, cough- none , dullness-none, rub- none,            Chest wall-  Abd-  Br/ Gen/ Rectal- Not done, not indicated Extrem- cyanosis- none, clubbing, none, atrophy- none, strength- nl Neuro- grossly intact to observation

## 2017-04-13 ENCOUNTER — Ambulatory Visit: Payer: Self-pay | Admitting: Podiatry

## 2017-12-09 IMAGING — CR DG CHEST 2V
2 series · 2 of 2 positions shown · non-contrast
Comparison: Chest x-ray of 10/10/2012

CLINICAL DATA: Left lower anterior rib and chest pain for 6 months,
history of sarcoidosis

EXAM:
CHEST  2 VIEW

[w chest pa]
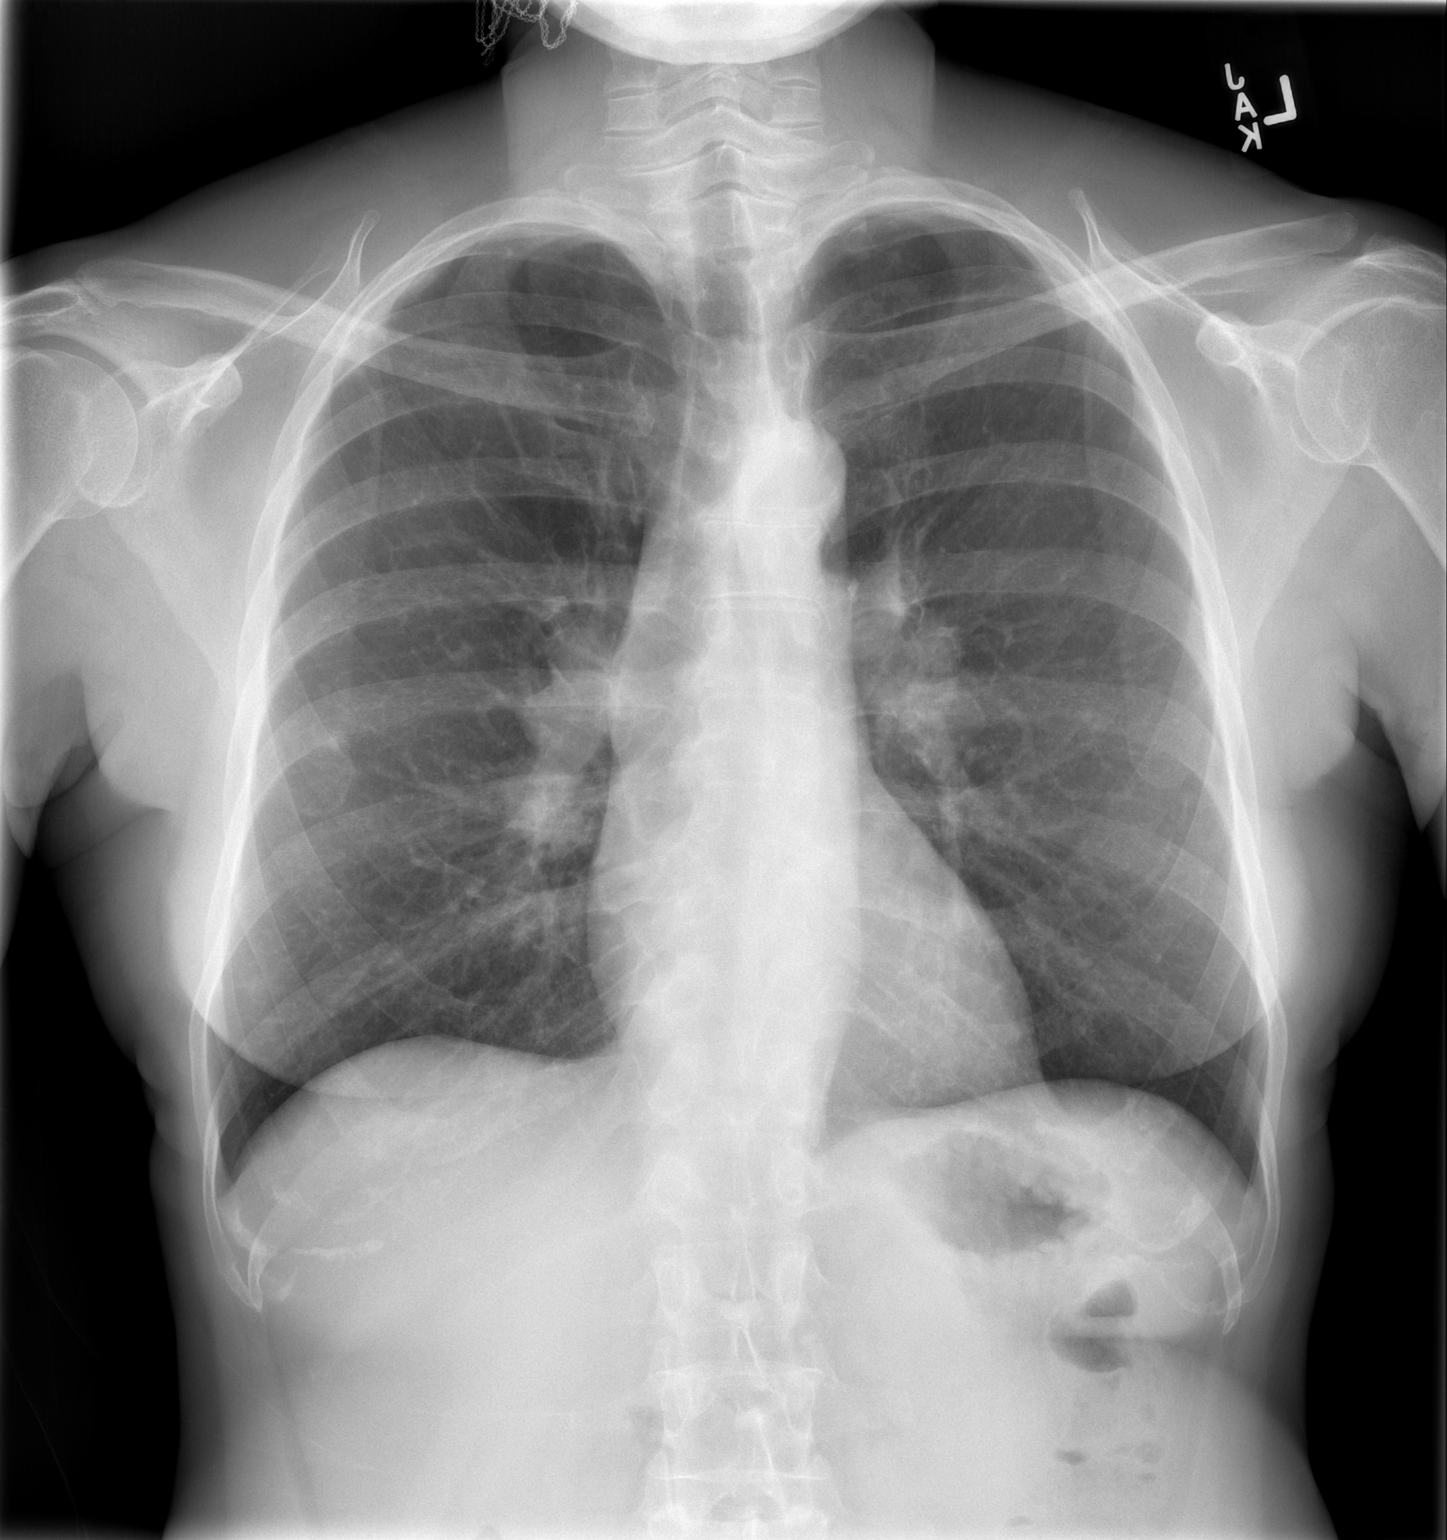

[w chest lat]
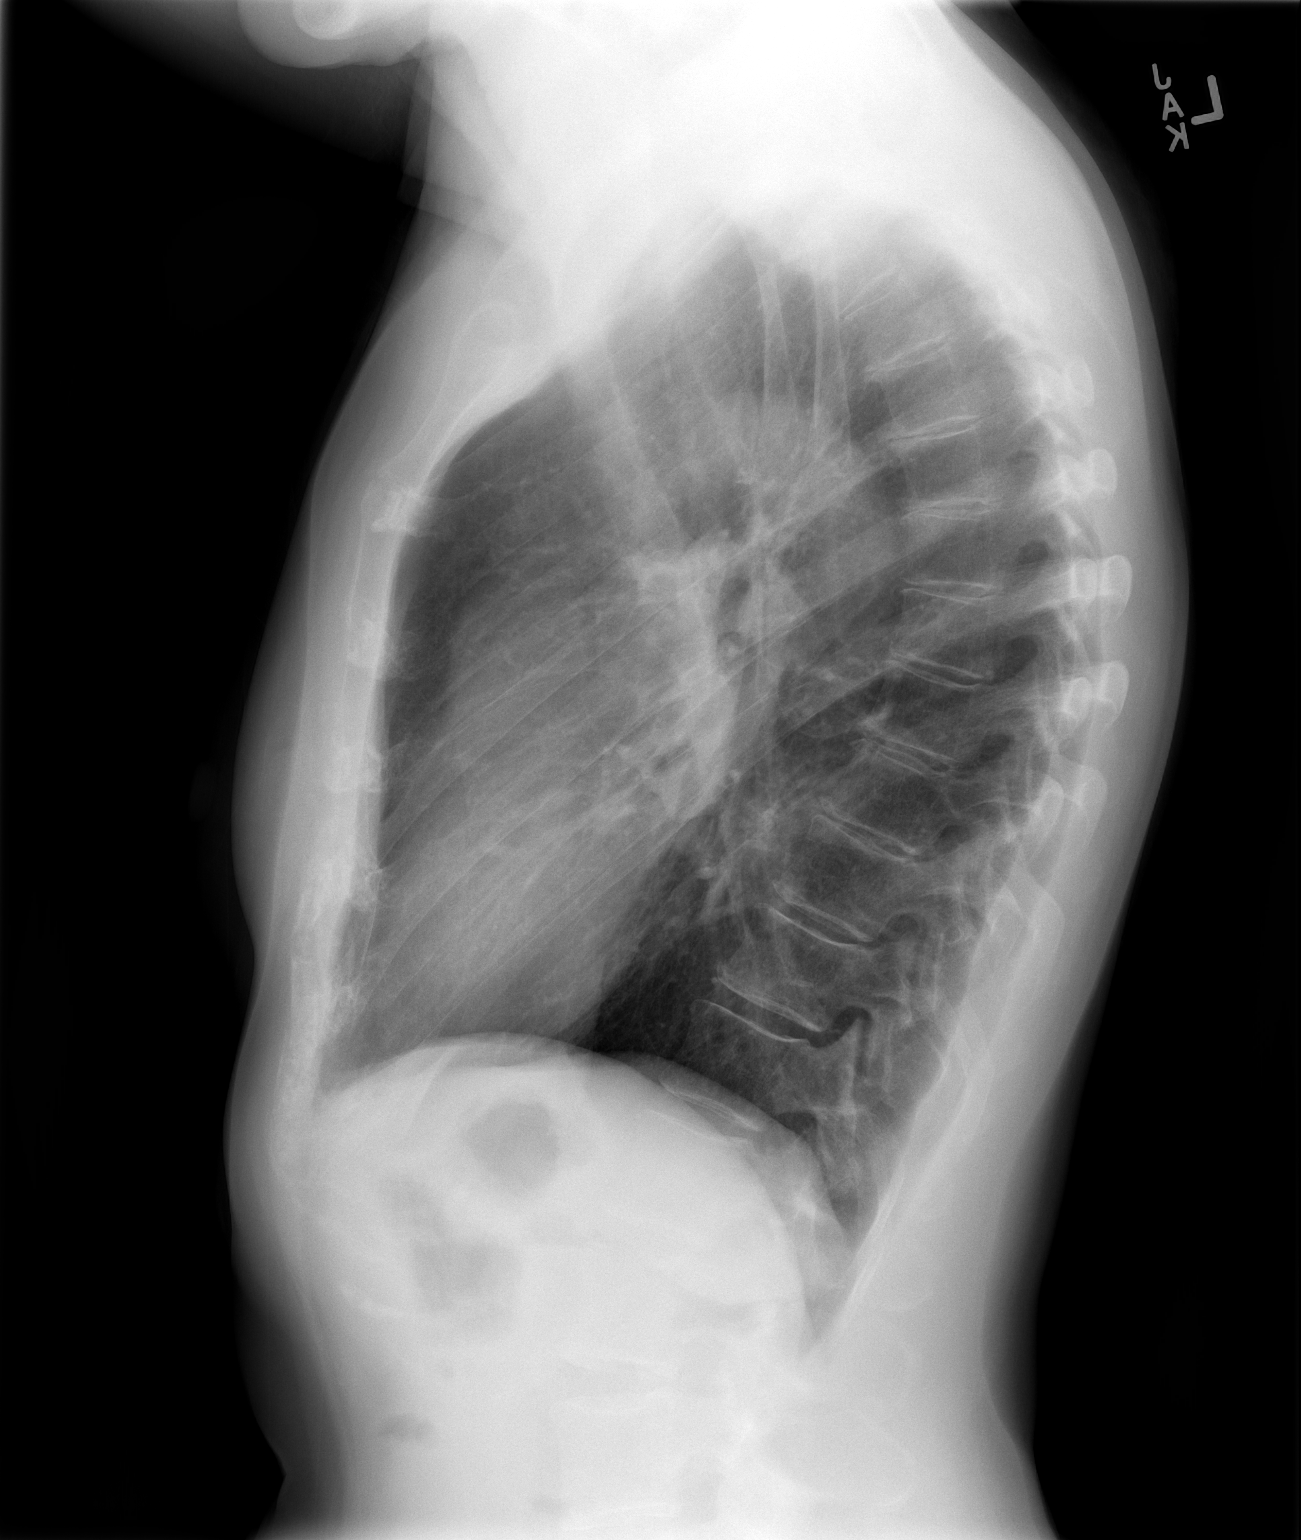

[2 of 2 positions shown; findings below may reference images not displayed]

FINDINGS: No infiltrate or effusion is seen. Nodular hila again are noted and
appear relatively stable consistent with hilar and/or mediastinal
adenopathy in this patient with history of sarcoidosis. The heart is
within normal limits in size. No bony abnormality is seen.
IMPRESSION: No change in nodular hila consistent with adenopathy in this patient
with sarcoidosis. No active process.

## 2018-01-25 DIAGNOSIS — I10 Essential (primary) hypertension: Secondary | ICD-10-CM | POA: Insufficient documentation

## 2018-01-25 DIAGNOSIS — E039 Hypothyroidism, unspecified: Secondary | ICD-10-CM | POA: Insufficient documentation

## 2018-01-25 DIAGNOSIS — E559 Vitamin D deficiency, unspecified: Secondary | ICD-10-CM

## 2018-01-25 DIAGNOSIS — M810 Age-related osteoporosis without current pathological fracture: Secondary | ICD-10-CM

## 2018-01-25 HISTORY — DX: Essential (primary) hypertension: I10

## 2018-01-25 HISTORY — DX: Age-related osteoporosis without current pathological fracture: M81.0

## 2018-01-25 HISTORY — DX: Vitamin D deficiency, unspecified: E55.9

## 2018-01-26 IMAGING — US US ABDOMEN COMPLETE
1 series · 14 of 25 positions shown · non-contrast
Comparison: None.

CLINICAL DATA: Left upper quadrant pain off and on for years

EXAM:
ABDOMEN ULTRASOUND COMPLETE

[Series 1: us abdomen complete · 0.20mm/px · 14 of 85 slices shown]
[im 1/85]
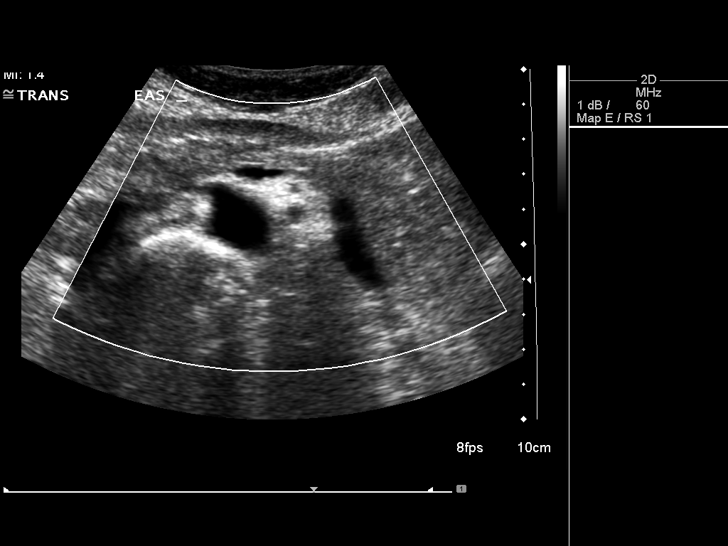
[im 8/85]
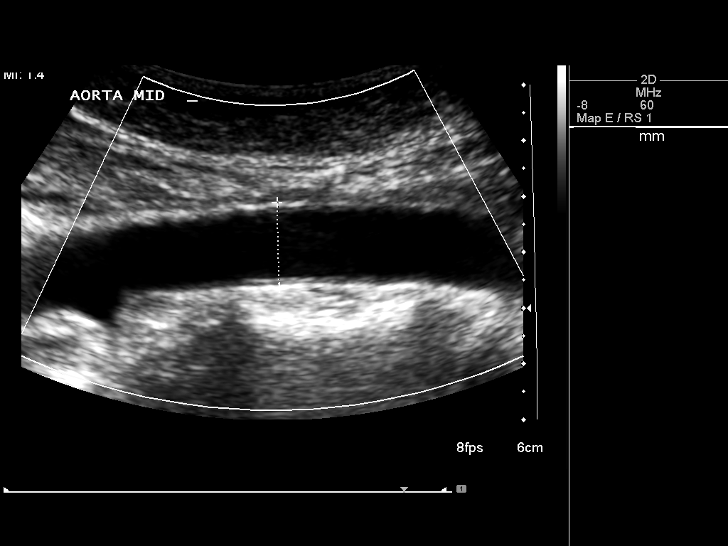
[im 15/85]
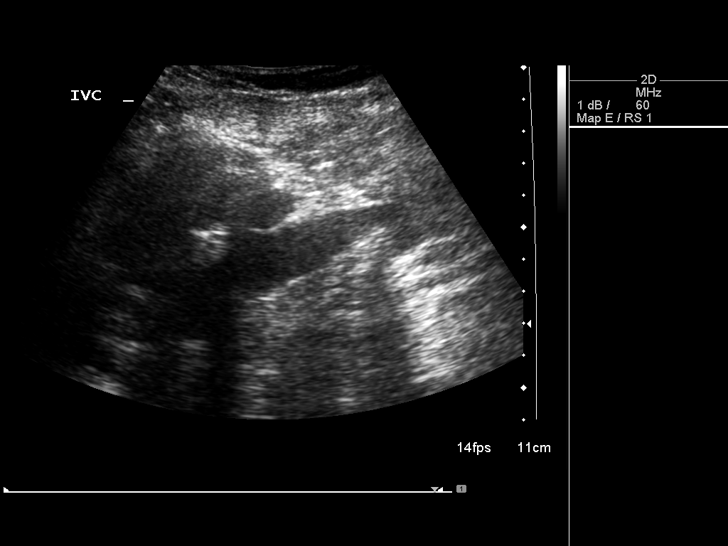
[im 22/85]
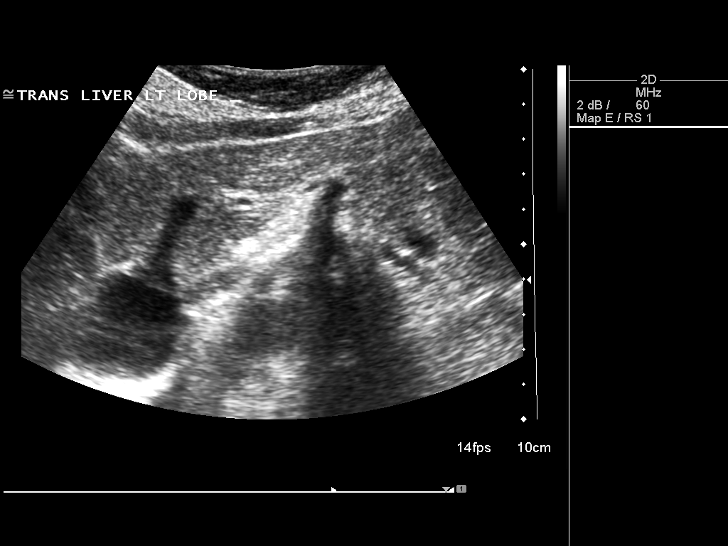
[im 29/85]
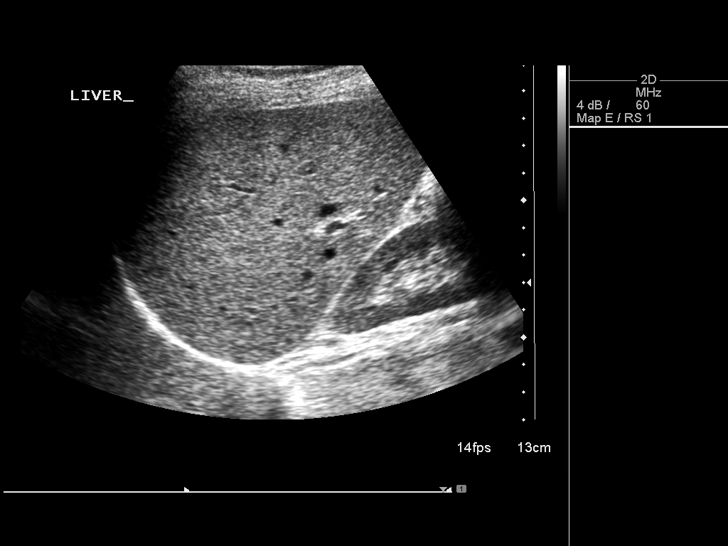
[im 32/85]
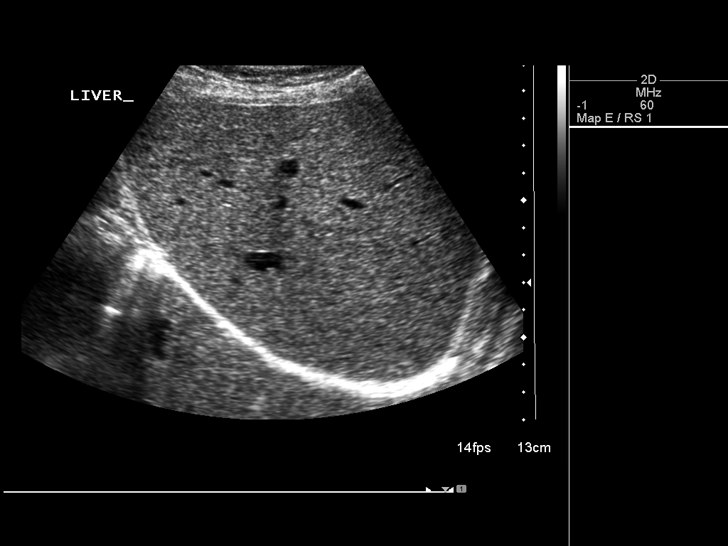
[im 39/85]
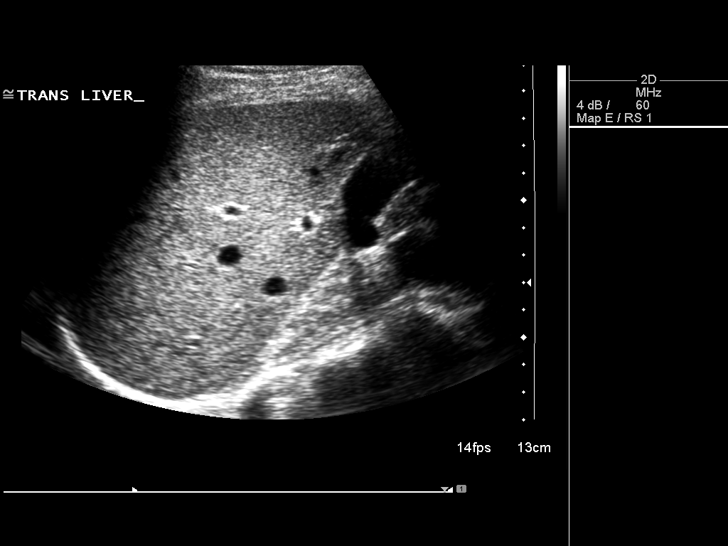
[im 46/85]
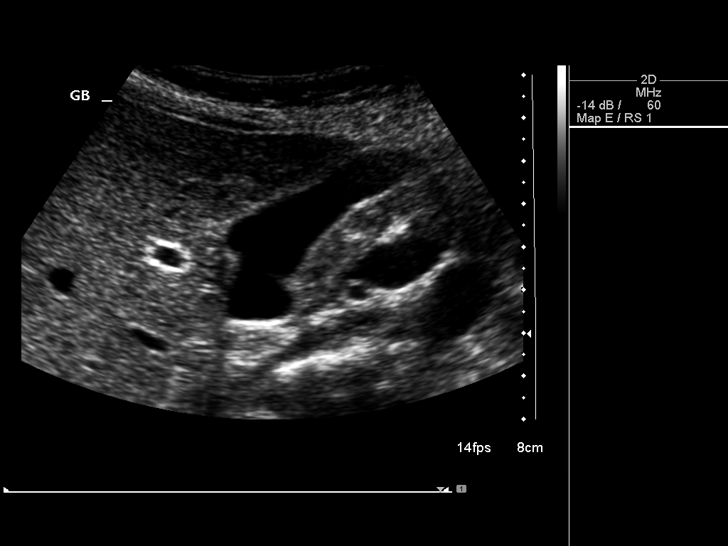
[im 53/85]
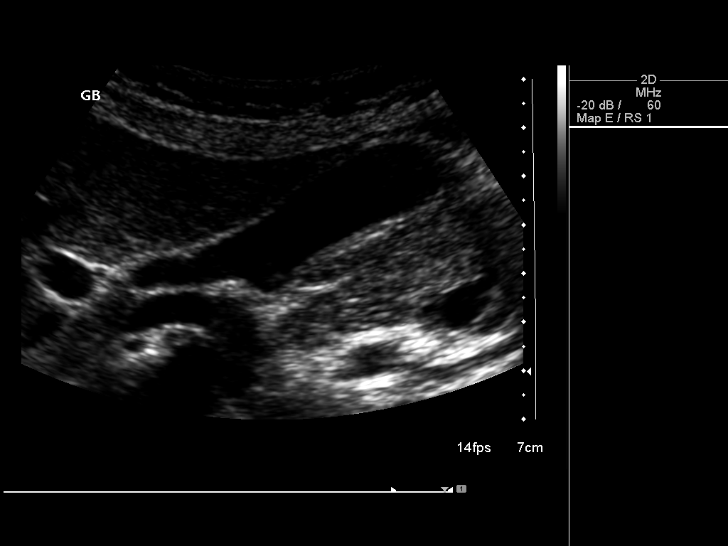
[im 57/85]
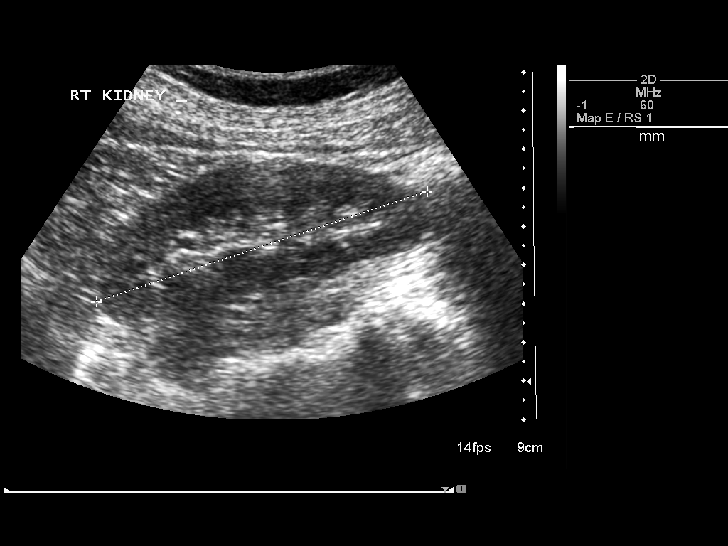
[im 64/85]
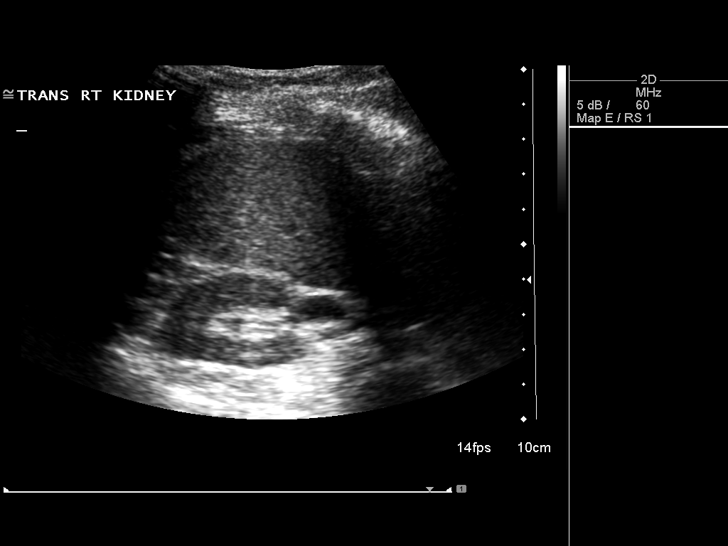
[im 71/85]
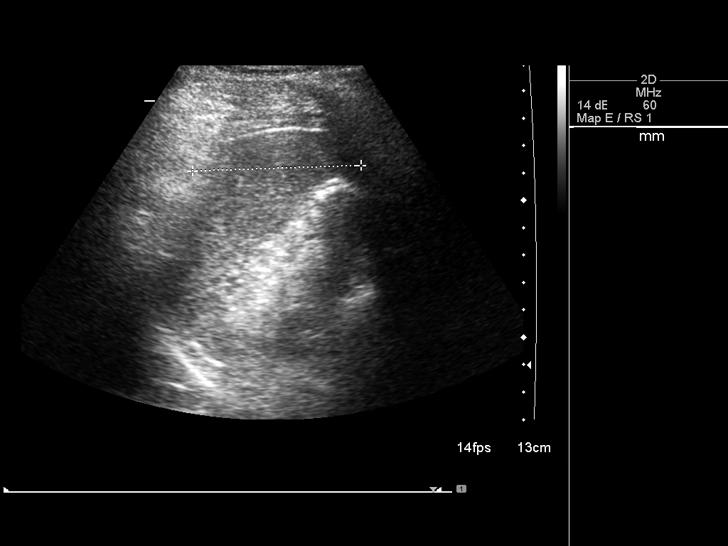
[im 78/85]
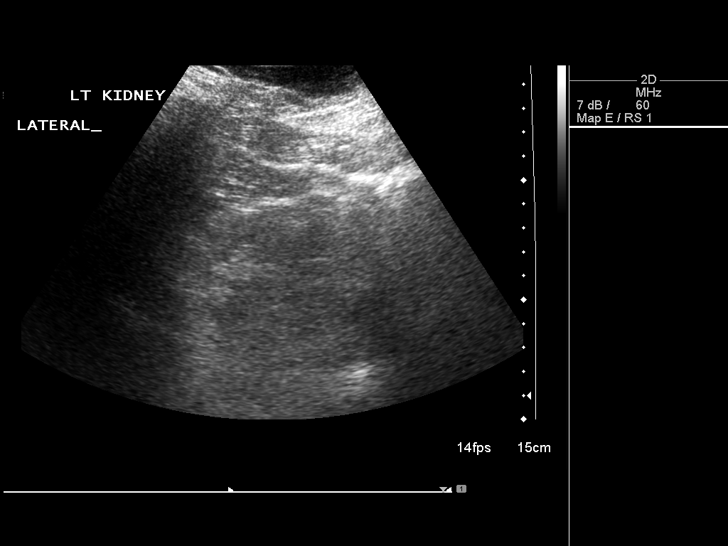
[im 85/85]
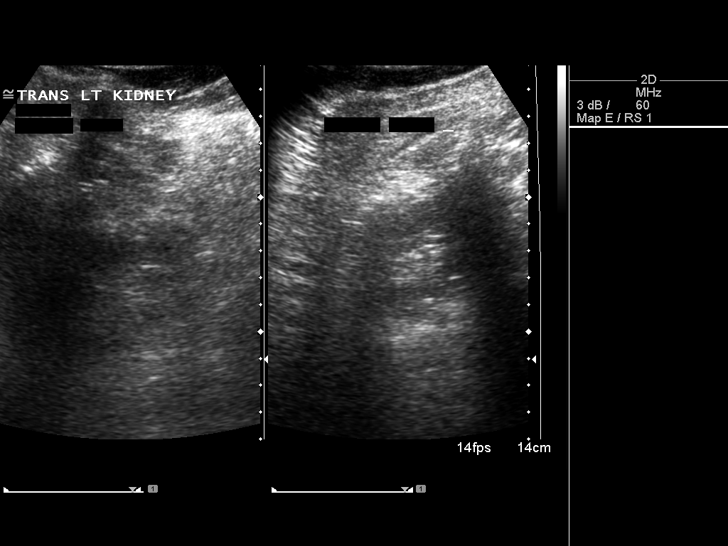

[14 of 25 positions shown; findings below may reference images not displayed]

FINDINGS: Gallbladder: No gallstones or wall thickening visualized. No
sonographic Murphy sign noted by sonographer.

Common bile duct: Diameter: Normal caliber, 3 mm.

Liver: No focal lesion identified. Within normal limits in
parenchymal echogenicity.

IVC: No abnormality visualized.

Pancreas: Visualized portion unremarkable.

Spleen: Size and appearance within normal limits.

Right Kidney: Length: 9 cm. 11 mm cyst in the upper pole.
Echogenicity within normal limits. No mass or hydronephrosis
visualized.

Left Kidney: Length: 10 cm. 19 mm cyst in the lower pole.
Echogenicity within normal limits. No mass or hydronephrosis
visualized.

Abdominal aorta: No aneurysm visualized. Irregular calcified plaque
noted distally.

Other findings: None.
IMPRESSION: No acute findings.

Irregular aortic plaque in the distal aorta.

## 2018-09-19 DIAGNOSIS — M62838 Other muscle spasm: Secondary | ICD-10-CM | POA: Insufficient documentation

## 2018-09-19 HISTORY — DX: Other muscle spasm: M62.838

## 2019-06-01 DIAGNOSIS — M722 Plantar fascial fibromatosis: Secondary | ICD-10-CM

## 2019-06-01 HISTORY — DX: Plantar fascial fibromatosis: M72.2

## 2019-07-13 ENCOUNTER — Other Ambulatory Visit: Payer: Self-pay

## 2019-07-13 DIAGNOSIS — Z20822 Contact with and (suspected) exposure to covid-19: Secondary | ICD-10-CM

## 2019-07-15 LAB — NOVEL CORONAVIRUS, NAA: SARS-CoV-2, NAA: NOT DETECTED

## 2021-04-04 ENCOUNTER — Ambulatory Visit (HOSPITAL_COMMUNITY)
Admission: EM | Admit: 2021-04-04 | Discharge: 2021-04-04 | Disposition: A | Discharge status: Home / Self Care | Payer: Self-pay | Attending: Student | Admitting: Student

## 2021-04-04 ENCOUNTER — Encounter (HOSPITAL_COMMUNITY): Payer: Self-pay | Admitting: Emergency Medicine

## 2021-04-04 ENCOUNTER — Other Ambulatory Visit: Payer: Self-pay

## 2021-04-04 DIAGNOSIS — J209 Acute bronchitis, unspecified: Secondary | ICD-10-CM

## 2021-04-04 DIAGNOSIS — D869 Sarcoidosis, unspecified: Secondary | ICD-10-CM

## 2021-04-04 MED ORDER — PREDNISONE 20 MG PO TABS: 40.0000 mg | ORAL_TABLET | Freq: Every day | ORAL | 0 refills | Status: AC

## 2021-04-04 MED ORDER — ALBUTEROL SULFATE HFA 108 (90 BASE) MCG/ACT IN AERS: 1.0000 | INHALATION_SPRAY | Freq: Four times a day (QID) | RESPIRATORY_TRACT | 0 refills | Status: AC | PRN

## 2021-04-04 NOTE — ED Triage Notes (Signed)
Cough x 1 week, non-productive, dry, mild sore throat. Denies fever, nausea, vomiting, chest pain.

## 2021-04-04 NOTE — Discharge Instructions (Addendum)
-  Prednisone, 2 pills taken at the same time for 5 days in a row.  Try taking this earlier in the day as it can give you energy. Avoid NSAIDs like ibuprofen and alleve while taking this medication as they can increase your risk of stomach upset and even GI bleeding when in combination with a steroid. You can continue tylenol (acetaminophen) up to 1000mg  3x daily. -Albuterol inhaler as needed for cough, shortness of breath  -Seek additional medical attention if new symptoms like worsening shortness of breath, new fevers/chills, new chest pain, new dizziness

## 2021-04-04 NOTE — ED Provider Notes (Signed)
[facility]    CSN: [id number] Arrival date & time: 04/04/21  0840      History   Chief Complaint Chief Complaint  Patient presents with   Cough    HPI Mary Taylor is a 65 y.o. female presenting with a cough x7 days. Medical history sarcoidosis, though she states that this is not giving her any issues in recent years.  She is not on any inhalers long-term.  Endorses nonproductive cough for 1 week, and mild sore throat.  Feeling well otherwise, denies shortness of breath, fever/chills, nausea, vomiting, chest pain.  Two negative home COVID test already.  HPI  Past Medical History:  Diagnosis Date   Anxiety disorder    situational   Hyperlipidemia    Hypertension    Hypothyroidism    Migraine    Osteopenia    Perennial allergic rhinitis    Sarcoidosis     Patient Active Problem List   Diagnosis Date Noted   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   Acute upper respiratory infection 05/22/2014   MUSCULOSKELETAL PAIN 03/15/2008   Sarcoidosis 03/14/2008   Seasonal and perennial allergic rhinitis 03/14/2008    Past Surgical History:  Procedure Laterality Date   BREAST CYST EXCISION  1970   THYROID SURGERY     goiter   TUBAL LIGATION  1987    OB History   No obstetric history on file.      Home Medications    Prior to Admission medications   Medication Sig Start Date End Date Taking? Authorizing Provider  albuterol (VENTOLIN HFA) 108 (90 Base) MCG/ACT inhaler Inhale 1-2 puffs into the lungs every 6 (six) hours as needed for wheezing or shortness of breath. 04/04/21  Yes Rhys Martini, PA-C  predniSONE (DELTASONE) 20 MG tablet Take 2 tablets (40 mg total) by mouth daily for 5 days. 04/04/21 04/09/21 Yes Rhys Martini, PA-C  ALPRAZolam Prudy Feeler) 0.25 MG tablet Take 0.25 mg by mouth at bedtime as needed for anxiety.    [provider]  cholecalciferol (VITAMIN D) 1000 UNITS tablet Take 1,000 Units by mouth daily.    [provider]   Cyanocobalamin (VITAMIN B 12 PO) Take by mouth as needed.    [provider]  cyclobenzaprine (FLEXERIL) 10 MG tablet Take 10 mg by mouth as needed for muscle spasms.    [provider]  fluticasone (FLONASE) 50 MCG/ACT nasal spray Place 2 sprays into both nostrils daily. 01/14/16   [provider]  SYNTHROID 75 MCG tablet Take 1 tablet by mouth daily. 09/27/12   [provider]    Family History Family History  Problem Relation Age of Onset   Hypertension Mother    Kidney failure Mother    Heart disease Father    Heart disease Brother    Prostate cancer Brother    Heart disease Sister     Social History Social History   Tobacco Use   Smoking status: Never   Smokeless tobacco: Never  Substance Use Topics   Alcohol use: No   Drug use: No     Allergies   Ibuprofen and Lexapro [escitalopram oxalate]   Review of Systems Review of Systems  Constitutional:  Negative for appetite change, chills and fever.  HENT:  Positive for congestion. Negative for ear pain, rhinorrhea, sinus pressure, sinus pain and sore throat.   Eyes:  Negative for redness and visual disturbance.  Respiratory:  Positive for cough. Negative for chest tightness, shortness of breath and  wheezing.   Cardiovascular:  Negative for chest pain and palpitations.  Gastrointestinal:  Negative for abdominal pain, constipation, diarrhea, nausea and vomiting.  Genitourinary:  Negative for dysuria, frequency and urgency.  Musculoskeletal:  Negative for myalgias.  Neurological:  Negative for dizziness, weakness and headaches.  Psychiatric/Behavioral:  Negative for confusion.   All other systems reviewed and are negative.   Physical Exam Triage Vital Signs ED Triage Vitals  Enc Vitals Group     BP 04/04/21 0935 (!) 167/81     Pulse Rate 04/04/21 0935 83     Resp 04/04/21 0935 14     Temp 04/04/21 0935 98.2 F (36.8 C)     Temp Source 04/04/21 0935 Oral     SpO2 04/04/21 0935  98 %     Weight --      Height --      Head Circumference --      Peak Flow --      Pain Score 04/04/21 0937 0     Pain Loc --      Pain Edu? --      Excl. in GC? --    No data found.  Updated Vital Signs BP (!) 167/81 (BP Location: Right Arm)   Pulse 83   Temp 98.2 F (36.8 C) (Oral)   Resp 14   SpO2 98%   Visual Acuity Right Eye Distance:   Left Eye Distance:   Bilateral Distance:    Right Eye Near:   Left Eye Near:    Bilateral Near:     Physical Exam Vitals reviewed.  Constitutional:      General: She is not in acute distress.    Appearance: Normal appearance. She is not ill-appearing.  HENT:     Head: Normocephalic and atraumatic.     Right Ear: Tympanic membrane, ear canal and external ear normal. No tenderness. No middle ear effusion. There is no impacted cerumen. Tympanic membrane is not perforated, erythematous, retracted or bulging.     Left Ear: Tympanic membrane, ear canal and external ear normal. No tenderness.  No middle ear effusion. There is no impacted cerumen. Tympanic membrane is not perforated, erythematous, retracted or bulging.     Nose: Nose normal. No congestion.     Mouth/Throat:     Mouth: Mucous membranes are moist.     Pharynx: Uvula midline. No oropharyngeal exudate or posterior oropharyngeal erythema.  Eyes:     Extraocular Movements: Extraocular movements intact.     Pupils: Pupils are equal, round, and reactive to light.  Cardiovascular:     Rate and Rhythm: Normal rate and regular rhythm.     Heart sounds: Normal heart sounds.  Pulmonary:     Effort: Pulmonary effort is normal. No tachypnea, bradypnea, accessory muscle usage, prolonged expiration or respiratory distress.     Breath sounds: Wheezing present. No decreased breath sounds, rhonchi or rales.     Comments: Few expiratory wheezes throughout Abdominal:     Palpations: Abdomen is soft.     Tenderness: There is no abdominal tenderness. There is no guarding or rebound.   Neurological:     General: No focal deficit present.     Mental Status: She is alert and oriented to person, place, and time.  Psychiatric:        Mood and Affect: Mood normal.        Behavior: Behavior normal.        Thought Content: Thought content normal.  Judgment: Judgment normal.     UC Treatments / Results  Labs (all labs ordered are listed, but only abnormal results are displayed) Labs Reviewed - No data to display  EKG   Radiology No results found.  Procedures Procedures (including critical care time)  Medications Ordered in UC Medications - No data to display  Initial Impression / Assessment and Plan / UC Course  I have reviewed the triage vital signs and the nursing notes.  Pertinent labs & imaging results that were available during my care of the patient were reviewed by me and considered in my medical decision making (see chart for details).     This patient is a very pleasant 65 y.o. year old female presenting with acute bronchitis. Today this pt is afebrile nontachycardic nontachypneic, oxygenating well on room air, few expiratory wheezes but no rhonchi or rales.   Sarcoidosis at baseline, patient states this is well controlled on no medications.   Negative home covid test, declines additional testing today.  Prednisone, albuterol as below.   ED return precautions discussed. Patient verbalizes understanding and agreement.   Level 4 for acute illness with systemic symptoms and prescription drug management.   Final Clinical Impressions(s) / UC Diagnoses   Final diagnoses:  Acute bronchitis, unspecified organism  Sarcoidosis     Discharge Instructions      -Prednisone, 2 pills taken at the same time for 5 days in a row.  Try taking this earlier in the day as it can give you energy. Avoid NSAIDs like ibuprofen and alleve while taking this medication as they can increase your risk of stomach upset and even GI bleeding when in combination  with a steroid. You can continue tylenol (acetaminophen) up to 1000mg  3x daily. -Albuterol inhaler as needed for cough, shortness of breath  -Seek additional medical attention if new symptoms like worsening shortness of breath, new fevers/chills, new chest pain, new dizziness     ED Prescriptions     Medication Sig Dispense Auth. Provider   predniSONE (DELTASONE) 20 MG tablet Take 2 tablets (40 mg total) by mouth daily for 5 days. 10 tablet , PA-C   albuterol (VENTOLIN HFA) 108 (90 Base) MCG/ACT inhaler Inhale 1-2 puffs into the lungs every 6 (six) hours as needed for wheezing or shortness of breath. 1 each Rhys Martini, PA-C      PDMP not reviewed this encounter.   Rhys Martini, PA-C 04/04/21 1124

## 2022-02-02 ENCOUNTER — Encounter: Payer: Self-pay | Admitting: Physician Assistant

## 2022-02-02 ENCOUNTER — Ambulatory Visit (INDEPENDENT_AMBULATORY_CARE_PROVIDER_SITE_OTHER): Payer: Medicare Other | Admitting: Physician Assistant

## 2022-02-02 ENCOUNTER — Other Ambulatory Visit: Payer: Medicare Other

## 2022-02-02 VITALS — BP 167/94 | HR 102 | Resp 18 | Ht 67.0 in | Wt 127.0 lb

## 2022-02-02 DIAGNOSIS — F419 Anxiety disorder, unspecified: Secondary | ICD-10-CM

## 2022-02-02 DIAGNOSIS — D509 Iron deficiency anemia, unspecified: Secondary | ICD-10-CM

## 2022-02-02 DIAGNOSIS — R413 Other amnesia: Secondary | ICD-10-CM

## 2022-02-02 DIAGNOSIS — R4182 Altered mental status, unspecified: Secondary | ICD-10-CM | POA: Diagnosis not present

## 2022-02-02 LAB — CBC
HCT: 42.9 % (ref 36.0–46.0)
Hemoglobin: 13.9 g/dL (ref 12.0–15.0)
MCHC: 32.4 g/dL (ref 30.0–36.0)
MCV: 86 fl (ref 78.0–100.0)
Platelets: 228 10*3/uL (ref 150.0–400.0)
RBC: 4.99 Mil/uL (ref 3.87–5.11)
RDW: 15.2 % (ref 11.5–15.5)
WBC: 5.6 10*3/uL (ref 4.0–10.5)

## 2022-02-02 LAB — FOLATE: Folate: >24.2 ng/mL (ref >5.9)

## 2022-02-02 LAB — FERRITIN: Ferritin: 19.8 ng/mL (ref 10.0–291.0)

## 2022-02-02 LAB — TSH: TSH: 1.42 u[IU]/mL (ref 0.35–5.50)

## 2022-02-02 LAB — VITAMIN B12: Vitamin B-12: 364 pg/mL (ref 211–911)

## 2022-02-02 NOTE — Progress Notes (Signed)
Assessment/Plan:   Mary Taylor is a very pleasant 66 y.o. year old RH female with  a history of hypertension, hyperlipidemia, non alcoholic liver disease, hypothyroidism, sarcoidosis, social anxiety, history of migraine headaches, seen today for evaluation of memory loss. MoCA today is 9/30, with delayed recall 0/5 and orientation is 2/6.  However, it is unclear if these results are suspicious for dementia.  The patient recently moved to [place], after 20 years seeing a different location, has recently changed jobs, and there is a question of adjustment disorder and high anxiety.  Work-up is in progress.    Recommendations:   MIld Cognitive Impairment  MRI brain with/without contrast to assess for underlying structural abnormality and assess vascular load  EEG, rule out seizure activity Check TSH, CBC,anemia panel Recommend B12 supplementation  Maintain good control of cardiovascular risk factors  Discussed the importance of regular daily schedule with inclusion of crossword puzzles to maintain brain  Folllow up in 1 month.  Subjective:   What am I doing " The patient is seen in neurologic consultation at the request of Mary Taylor,* for the evaluation of memory difficulties.  The patient is accompanied by 2 of her daughters in person, 1 on the phone who supplement the history.  How long did patient have memory difficulties?  Patient daughter reports that she noticed her mother having some memory difficulties for about 6 months, when she began to repeat herself more frequently and having a hard time remembering conversations.  She even bought over-the-counter memory supplement that did not help.  However, the patient reported to incidents that got her concerned. One was 1 month ago, when she woke up, and she was disoriented, she did not know the time, date, but as soon as she saw the paperwork on top of the table (of her new job), she knew where she was.  This  happened once again.  Of note, she states that when "I have something to do,I am okay. It is  when I do not have anything to do that I become like this ".  She denies any recent tics, infections, COVID, she denies any prior history of seizures.  Her daughter states "that the only thing that helped her it was a little lorazepam, then she was okay ".  When she has the "blank moments ", she denies loss of consciousness, or any other seizure symptoms. Patient lives with daughter, who noticed that she began repeating herself about 3 months ago, she says that the anxiety medicine helps, and this is worse in the morning.   Disoriented when walking into a room?  Endorsed, as above Leaving objects in unusual places?  Patient denies   Ambulates  with difficulty?   Patient denies   Recent falls?  Patient denies   Any head injuries?  Patient denies   History of seizures?   Patient denies, and she also denies any body aching after these "blank", any involuntary secretions, involuntary urination, falls, metallic or blood taste.   Wandering behavior?  Patient denies   Patient drives?   She continues to drive, denies any difficulty doing so.  She denies getting lost.   Any mood changes such irritability agitation?  Since moving, to be with her family here in [place], the patient may be experiencing some situational depression.  She never was diagnosed with depression or treated for it.  However, she admits to feeling "a little lost in the new environment.  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 Hallucinations?  Patient denies   Paranoia?  Patient denies   Patient reports that he sleeps well without vivid dreams, REM behavior or sleepwalking   History of sleep apnea?  Patient denies   Any hygiene concerns?  Patient denies   Independent of bathing and dressing?  Endorsed  Does the patient needs help with medications?  Patient denies.   Who is in  charge of the finances?  Patient is in charge Any changes in appetite?  Patient denies   Patient have trouble swallowing? Patient denies   Does the patient cook?  Patient denies   Any kitchen accidents such as leaving the stove on? Patient denies   No vision changes Any focal numbness or tingling?  She has a history of bilateral foot neuropathy, followed by PCP. Chronic back pain Patient denies   Unilateral weakness?  Patient denies   Any tremors?  Patient denies   Any history of anosmia?  Patient denies   Any incontinence of urine?  Patient denies   Any bowel dysfunction?   Patient denies   History of heavy alcohol intake?  Patient denies   History of heavy tobacco use?  Patient denies   Family history of dementia?  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 Has just retired from normal life in August 2022, [place] 2022 [place] due to stressful situation.  Recent Labs B12  358, Vit D 68.2,    Past Medical History:  Diagnosis Date   Anxiety disorder    situational   Hyperlipidemia    Hypertension    Hypothyroidism    Migraine    Osteopenia    Perennial allergic rhinitis    Sarcoidosis      Past Surgical History:  Procedure Laterality Date   BREAST CYST EXCISION  1970   THYROID SURGERY     goiter   TUBAL LIGATION  1987     Allergies  Allergen Reactions   Ibuprofen     Stomach pains   Lexapro [Escitalopram Oxalate]     Hoarse    Current Outpatient Medications  Medication Instructions   albuterol (VENTOLIN HFA) 108 (90 Base) MCG/ACT inhaler 1-2 puffs, Inhalation, Every 6 hours PRN   ALPRAZolam (XANAX) 0.25 mg, At bedtime PRN   amLODipine (NORVASC) 5 MG tablet amlodipine 5 mg tablet  1 TABLET ORALLY ONCE A DAY, DR REDDY BRAND 90 DAYS   cholecalciferol (VITAMIN D) 1,000 Units, Daily   Cyanocobalamin (VITAMIN B 12 PO) As needed   cyclobenzaprine (FLEXERIL) 10 mg, As needed   fluticasone (FLONASE) 50 MCG/ACT nasal spray 2  sprays, Daily   SYNTHROID 75 MCG tablet 1 tablet, Daily     VITALS:   Vitals:   02/02/22 0756  BP: (!) 167/94  Pulse: (!) 102  Resp: 18  SpO2: 95%  Weight: 127 lb (57.6 kg)  Height: 5\' 7"  (1.702 m)       No data to display          PHYSICAL EXAM   HEENT:  Normocephalic, atraumatic. The mucous membranes are moist. The superficial temporal arteries are without ropiness or tenderness. Cardiovascular: Regular rate and rhythm. Lungs: Clear to auscultation bilaterally. Neck: There are no carotid bruits noted bilaterally.  NEUROLOGICAL:    02/02/2022    9:00 AM  Montreal Cognitive Assessment   Visuospatial/ Executive (0/5) 1  Naming (0/3) 2  Attention: Read list  of digits (0/2) 2  Attention: Read list of letters (0/1) 1  Attention: Serial 7 subtraction starting at 100 (0/3) 0  Language: Repeat phrase (0/2) 1  Language : Fluency (0/1) 0  Abstraction (0/2) 0  Delayed Recall (0/5) 0  Orientation (0/6) 2  Total 9  Adjusted Score (based on education) 9        No data to display           Orientation:  Alert and oriented to person, place and time. No aphasia or dysarthria. Fund of knowledge is appropriate. Recent memory impaired and remote memory intact.  Attention and concentration are normal.  Able to name objects and repeat phrases. Delayed recall   / Cranial nerves: There is good facial symmetry. Extraocular muscles are intact and visual fields are full to confrontational testing. Speech is fluent and clear. Soft palate rises symmetrically and there is no tongue deviation. Hearing is intact to conversational tone. Tone: Tone is good throughout. Sensation: Sensation is intact to light touch and pinprick throughout. Vibration is intact at the bilateral big toe.There is no extinction with double simultaneous stimulation. There is no sensory dermatomal level identified. Coordination: The patient has no difficulty with RAM's or FNF bilaterally. Normal finger to nose   Motor: Strength is 5/5 in the bilateral upper and lower extremities. There is no pronator drift. There are no fasciculations noted. DTR's: Deep tendon reflexes are 2/4 at the bilateral biceps, triceps, brachioradialis, patella and achilles.  Plantar responses are downgoing bilaterally. Gait and Station: The patient is able to ambulate without difficulty.The patient is able to heel toe walk without any difficulty.The patient is able to ambulate in a tandem fashion. The patient is able to stand in the Romberg position.     Thank you for allowing [place] the opportunity to participate in the care of this nice patient. Please do not hesitate to contact us for any questions or concerns.   Total time spent on today's visit was 61 minutes dedicated to this patient today, preparing to see patient, examining the patient, ordering tests and/or medications and counseling the patient, documenting clinical information in the EHR or other health record, independently interpreting results and communicating results to the patient/family, discussing treatment and goals, answering patient's questions and coordinating care.  Cc:  Cain Saupe, MD  Marlowe Kays 02/02/2022 9:31 AM

## 2022-02-02 NOTE — Patient Instructions (Signed)
It was a pleasure to see you today at our office.   Recommendations:  Follow up in 1 month MRI brain  Order labs today  EEG Consider psychotherapy   Whom to call:  Memory  decline, memory medications: Call our office [phone]   For psychiatric meds, mood meds: Please have your primary care physician manage these medications.   Counseling regarding caregiver distress, including caregiver depression, anxiety and issues regarding community resources, adult day care programs, adult living facilities, or memory care questions:   Feel free to contact Misty Lisabeth Register, Social Worker at [phone]   For assessment of decision of mental capacity and competency:  Call Dr. Erick Blinks, geriatric psychiatrist at [phone]      RECOMMENDATIONS FOR ALL PATIENTS WITH MEMORY PROBLEMS: 1. Continue to exercise (Recommend 30 minutes of walking everyday, or 3 hours every week) 2. Increase social interactions - continue going to [place] and enjoy social gatherings with friends and family 3. Eat healthy, avoid fried foods and eat more fruits and vegetables 4. Maintain adequate blood pressure, blood sugar, and blood cholesterol level. Reducing the risk of stroke and cardiovascular disease also helps promoting better memory. 5. Avoid stressful situations. Live a simple life and avoid aggravations. Organize your time and prepare for the next day in anticipation. 6. Sleep well, avoid any interruptions of sleep and avoid any distractions in the bedroom that may interfere with adequate sleep quality 7. Avoid sugar, avoid sweets as there is a strong link between excessive sugar intake, diabetes, and cognitive impairment We discussed the Mediterranean diet, which has been shown to help patients reduce the risk of progressive memory disorders and reduces cardiovascular risk. This includes eating fish, eat fruits and green leafy vegetables, nuts like almonds and hazelnuts, walnuts, and also use olive  oil. Avoid fast foods and fried foods as much as possible. Avoid sweets and sugar as sugar use has been linked to worsening of memory function.  There is always a concern of gradual progression of memory problems. If this is the case, then we may need to adjust level of care according to patient needs. Support, both to the patient and caregiver, should then be put into place.    FALL PRECAUTIONS: Be cautious when walking. Scan the area for obstacles that may increase the risk of trips and falls. When getting up in the mornings, sit up at the edge of the bed for a few minutes before getting out of bed. Consider elevating the bed at the head end to avoid drop of blood pressure when getting up. Walk always in a well-lit room (use night lights in the walls). Avoid area rugs or power cords from appliances in the middle of the walkways. Use a walker or a cane if necessary and consider physical therapy for balance exercise. Get your eyesight checked regularly.  FINANCIAL OVERSIGHT: Supervision, especially oversight when making financial decisions or transactions is also recommended.  HOME SAFETY: Consider the safety of the kitchen when operating appliances like stoves, microwave oven, and blender. Consider having supervision and share cooking responsibilities until no longer able to participate in those. Accidents with firearms and other hazards in the house should be identified and addressed as well.   ABILITY TO BE LEFT ALONE: If patient is unable to contact 911 operator, consider using LifeLine, or when the need is there, arrange for someone to stay with patients. Smoking is a fire hazard, consider supervision or cessation. Risk of wandering should be assessed by caregiver and if  detected at any point, supervision and safe proof recommendations should be instituted.  MEDICATION SUPERVISION: Inability to self-administer medication needs to be constantly addressed. Implement a mechanism to ensure safe  administration of the medications.   DRIVING: Regarding driving, in patients with progressive memory problems, driving will be impaired. We advise to have someone else do the driving if trouble finding directions or if minor accidents are reported. Independent driving assessment is available to determine safety of driving.   If you are interested in the driving assessment, you can contact the following:  [facility] in [place] [phone]  [facility] [phone]  [facility] [phone]  [facility] [phone] or [phone]

## 2022-02-03 LAB — IRON AND TIBC
Iron Saturation: 38 % (ref 15–55)
Iron: 100 ug/dL (ref 27–139)
Total Iron Binding Capacity: 262 ug/dL (ref 250–450)
UIBC: 162 ug/dL (ref 118–369)

## 2022-02-03 NOTE — Progress Notes (Signed)
Please inform patient all the labs were normal thanks

## 2022-02-04 NOTE — Progress Notes (Signed)
Left message normal, to call if any quesitons.

## 2022-02-12 ENCOUNTER — Ambulatory Visit (HOSPITAL_COMMUNITY)
Admission: RE | Admit: 2022-02-12 | Discharge: 2022-02-12 | Disposition: A | Discharge status: Home / Self Care | Payer: Medicare Other | Source: Ambulatory Visit | Attending: Physician Assistant | Admitting: Physician Assistant

## 2022-02-12 DIAGNOSIS — R4182 Altered mental status, unspecified: Secondary | ICD-10-CM | POA: Diagnosis present

## 2022-02-12 DIAGNOSIS — R413 Other amnesia: Secondary | ICD-10-CM | POA: Diagnosis not present

## 2022-02-12 NOTE — Progress Notes (Cosign Needed)
Outpatient EEG complete - results pending.  

## 2022-02-15 ENCOUNTER — Ambulatory Visit
Admission: RE | Admit: 2022-02-15 | Discharge: 2022-02-15 | Disposition: A | Discharge status: Home / Self Care | Payer: Medicare Other | Source: Ambulatory Visit | Attending: Physician Assistant | Admitting: Physician Assistant

## 2022-02-20 NOTE — Progress Notes (Signed)
Please inform patient MRI of the brain shows some mild overall volume loss and some chronic vessel changes, no acute findings. Thanks

## 2022-02-20 NOTE — Progress Notes (Signed)
No answer at 3:32pm 02/20/2022

## 2022-03-05 ENCOUNTER — Ambulatory Visit (INDEPENDENT_AMBULATORY_CARE_PROVIDER_SITE_OTHER): Payer: Medicare Other | Admitting: Physician Assistant

## 2022-03-05 ENCOUNTER — Encounter: Payer: Self-pay | Admitting: Physician Assistant

## 2022-03-05 VITALS — BP 147/86 | HR 80 | Resp 18 | Ht 66.0 in | Wt 127.0 lb

## 2022-03-05 DIAGNOSIS — F0280 Dementia in other diseases classified elsewhere without behavioral disturbance: Secondary | ICD-10-CM | POA: Diagnosis not present

## 2022-03-05 DIAGNOSIS — R413 Other amnesia: Secondary | ICD-10-CM | POA: Diagnosis not present

## 2022-03-05 DIAGNOSIS — G309 Alzheimer's disease, unspecified: Secondary | ICD-10-CM | POA: Diagnosis not present

## 2022-03-05 MED ORDER — DONEPEZIL HCL 10 MG PO TABS: ORAL_TABLET | ORAL | 11 refills | Status: DC

## 2022-03-05 NOTE — Patient Instructions (Signed)
It was a pleasure to see you today at our office.   Recommendations:  Follow up in  103month Neurocognitive testing  Start Donepezil 10 mg :Take half tablet (5 mg) daily for 2 weeks, then increase to the full tablet at 10 mg daily. Whom to call:  Memory  decline, memory medications: Call our office [phone]   For psychiatric meds, mood meds: Please have your primary care physician manage these medications.   Counseling regarding caregiver distress, including caregiver depression, anxiety and issues regarding community resources, adult day care programs, adult living facilities, or memory care questions:   Feel free to contact Misty Lisabeth Register, Social Worker at [phone]   For assessment of decision of mental capacity and competency:  Call Dr. Erick Blinks, geriatric psychiatrist at [phone]      RECOMMENDATIONS FOR ALL PATIENTS WITH MEMORY PROBLEMS: 1. Continue to exercise (Recommend 30 minutes of walking everyday, or 3 hours every week) 2. Increase social interactions - continue going to [place] and enjoy social gatherings with friends and family 3. Eat healthy, avoid fried foods and eat more fruits and vegetables 4. Maintain adequate blood pressure, blood sugar, and blood cholesterol level. Reducing the risk of stroke and cardiovascular disease also helps promoting better memory. 5. Avoid stressful situations. Live a simple life and avoid aggravations. Organize your time and prepare for the next day in anticipation. 6. Sleep well, avoid any interruptions of sleep and avoid any distractions in the bedroom that may interfere with adequate sleep quality 7. Avoid sugar, avoid sweets as there is a strong link between excessive sugar intake, diabetes, and cognitive impairment We discussed the Mediterranean diet, which has been shown to help patients reduce the risk of progressive memory disorders and reduces cardiovascular risk. This includes eating fish, eat fruits and green  leafy vegetables, nuts like almonds and hazelnuts, walnuts, and also use olive oil. Avoid fast foods and fried foods as much as possible. Avoid sweets and sugar as sugar use has been linked to worsening of memory function.  There is always a concern of gradual progression of memory problems. If this is the case, then we may need to adjust level of care according to patient needs. Support, both to the patient and caregiver, should then be put into place.    FALL PRECAUTIONS: Be cautious when walking. Scan the area for obstacles that may increase the risk of trips and falls. When getting up in the mornings, sit up at the edge of the bed for a few minutes before getting out of bed. Consider elevating the bed at the head end to avoid drop of blood pressure when getting up. Walk always in a well-lit room (use night lights in the walls). Avoid area rugs or power cords from appliances in the middle of the walkways. Use a walker or a cane if necessary and consider physical therapy for balance exercise. Get your eyesight checked regularly.  FINANCIAL OVERSIGHT: Supervision, especially oversight when making financial decisions or transactions is also recommended.  HOME SAFETY: Consider the safety of the kitchen when operating appliances like stoves, microwave oven, and blender. Consider having supervision and share cooking responsibilities until no longer able to participate in those. Accidents with firearms and other hazards in the house should be identified and addressed as well.   ABILITY TO BE LEFT ALONE: If patient is unable to contact 911 operator, consider using LifeLine, or when the need is there, arrange for someone to stay with patients. Smoking is a Air cabin crew hazard,  consider supervision or cessation. Risk of wandering should be assessed by caregiver and if detected at any point, supervision and safe proof recommendations should be instituted.  MEDICATION SUPERVISION: Inability to self-administer  medication needs to be constantly addressed. Implement a mechanism to ensure safe administration of the medications.   DRIVING: Regarding driving, in patients with progressive memory problems, driving will be impaired. We advise to have someone else do the driving if trouble finding directions or if minor accidents are reported. Independent driving assessment is available to determine safety of driving.   If you are interested in the driving assessment, you can contact the following:  [facility] in [place] [phone]  [facility] [phone]  [facility] [phone]  [facility] [phone] or [phone]

## 2022-03-05 NOTE — Progress Notes (Signed)
Assessment/Plan:   Early onset dementia, mild, likely due to Alzheimer's disease.  Mary Taylor is a very pleasant 66 y.o. RH female  with  a history of hypertension, hyperlipidemia, non alcoholic liver disease, hypothyroidism, sarcoidosis, social anxiety, history of migraine headaches, seen today for evaluation of memory loss.  Since her last visit (at which time her MoCA was 9/30), her repeat MoCA today is actually 16/30, with delayed recall to 1/5, and visual-spatial executive 3/5.  Naming was 2/3.  Still there are some issues with attention.  Last EEG was normal, and MRI of the brain on 02/15/2022 was remarkable for mild global parenchymal volume loss without lobar predominance or disproportionate hyper compound atrophy, mild chronic white matter microangiopathy.  Patient is not on antidementia medications   Recommendations:   Neurocognitive testing for clarity of the diagnosis and disease trajectory, and to find out any other causes of memory loss such as depression, social anxiety, etc. Start donepezil 10 mg daily, take half tab of p.o. nightly for 2 weeks then increase to 1 full tab of nightly if tolerated.  Side effects discussed Recommend aspirin daily, and to monitor closely her cardiovascular risk factors. Follow-up after neurocognitive testing.    Case discussed with Dr. Karel Jarvis who agrees with the plan     Subjective:   This patient is accompanied in the office by her 2 daughters who supplements the history.  Previous records as well as any outside records available were reviewed prior to todays visit.  Patient was last seen at our office on 02/02/22 at which time her MoCA was 9/30. She is not on antidementia medications.   Any changes in memory since last visit?  "Better, doing stuff in the house, beginning to read more, doing crossword puzzles ".  "School is out but I am planning to go back in August ".  "I am adapting better to living here in [place].  " Patient  lives with: Her 2 daughters repeats oneself?  Endorsed Disoriented when walking into a room?  The disorientation around the house has resolved. Leaving objects in unusual places?  Patient denies   Ambulates  with difficulty?   Patient denies   Recent falls?  Patient denies   Any head injuries?  Patient denies   History of seizures?   Patient denies   Wandering behavior?  Patient denies   Patient drives?   She is now driving short distances without any issues. Any mood changes such irritability agitation?  Patient denies   Any history of depression?:  Patient denies   Hallucinations?  Patient denies   Paranoia?  Patient denies   Patient reports that he sleeps well without vivid dreams, REM behavior or sleepwalking   History of sleep apnea?  Patient denies   Any hygiene concerns?  Patient denies   Independent of bathing and dressing?  Endorsed  Does the patient needs help with medications?  Denies Who is in charge of the finances?   is in charge    Any changes in appetite?  Patient denies   Patient have trouble swallowing? Patient denies   Does the patient cook?  Patient denies   Any kitchen accidents such as leaving the stove on? Patient denies   Any headaches?  Patient denies   Double vision? Patient denies   Any focal numbness or tingling?  She has a history of bilateral foot neuropathy, followed by PCP. Chronic back pain Patient denies   Unilateral weakness?  Patient denies   Any tremors?  Patient denies   Any history of anosmia?  Patient denies   Any incontinence of urine?  Patient denies   Any bowel dysfunction?   Patient denies       Past Medical History:  Diagnosis Date   Anxiety disorder    situational   Hyperlipidemia    Hypertension    Hypothyroidism    Migraine    Osteopenia    Perennial allergic rhinitis    Sarcoidosis      Past Surgical History:  Procedure Laterality Date   BREAST CYST EXCISION  1970   THYROID SURGERY     goiter   TUBAL LIGATION  1987      PREVIOUS MEDICATIONS:   CURRENT MEDICATIONS:  Outpatient Encounter Medications as of 03/05/2022  Medication Sig   albuterol (VENTOLIN HFA) 108 (90 Base) MCG/ACT inhaler Inhale 1-2 puffs into the lungs every 6 (six) hours as needed for wheezing or shortness of breath.   ALPRAZolam (XANAX) 0.25 MG tablet Take 0.25 mg by mouth at bedtime as needed for anxiety.   amLODipine (NORVASC) 5 MG tablet amlodipine 5 mg tablet  1 TABLET ORALLY ONCE A DAY, DR REDDY BRAND 90 DAYS   cholecalciferol (VITAMIN D) 1000 UNITS tablet Take 1,000 Units by mouth daily.   Cyanocobalamin (VITAMIN B 12 PO) Take by mouth as needed.   cyclobenzaprine (FLEXERIL) 10 MG tablet Take 10 mg by mouth as needed for muscle spasms.   donepezil (ARICEPT) 10 MG tablet Take half tablet (5 mg) daily for 2 weeks, then increase to the full tablet at 10 mg daily   fluticasone (FLONASE) 50 MCG/ACT nasal spray Place 2 sprays into both nostrils daily.   SYNTHROID 75 MCG tablet Take 1 tablet by mouth daily.   No facility-administered encounter medications on file as of 03/05/2022.     Objective:     PHYSICAL EXAMINATION:    VITALS:   Vitals:   03/05/22 0848  BP: (!) 147/86  Pulse: 80  Resp: 18  SpO2: 100%  Weight: 127 lb (57.6 kg)  Height: 5\' 6"  (1.676 m)    GEN:  The patient appears stated age and is in NAD. HEENT:  Normocephalic, atraumatic.   Neurological examination:  General: NAD, well-groomed, appears stated age. Orientation: The patient is alert. Oriented to person, place and date Cranial nerves: There is good facial symmetry.The speech is fluent and clear. No aphasia or dysarthria. Fund of knowledge is appropriate. Recent memory impaired and remote memory is normal.  Attention and concentration are normal.  Able to name objects and repeat phrases.  Hearing is intact to conversational tone.    Sensation: Sensation is intact to light touch throughout Motor: Strength is at least antigravity x4. Tremors: none   DTR's 2/4 in UE/LE      03/06/2022   12:00 PM 02/02/2022    9:00 AM  Montreal Cognitive Assessment   Visuospatial/ Executive (0/5) 3 1  Naming (0/3) 2 2  Attention: Read list of digits (0/2) 1 2  Attention: Read list of letters (0/1) 1 1  Attention: Serial 7 subtraction starting at 100 (0/3) 0 0  Language: Repeat phrase (0/2) 1 1  Language : Fluency (0/1) 1 0  Abstraction (0/2) 1 0  Delayed Recall (0/5) 1 0  Orientation (0/6) 5 2  Total 16 9  Adjusted Score (based on education) 16 9        No data to display             Movement examination:  Tone: There is normal tone in the UE/LE Abnormal movements:  no tremor.  No myoclonus.  No asterixis.   Coordination:  There is no decremation with RAM's. Normal finger to nose  Gait and Station: The patient has no difficulty arising out of a deep-seated chair without the use of the hands. The patient's stride length is good.  Gait is cautious and narrow.   Thank you for allowing [place] the opportunity to participate in the care of this nice patient. Please do not hesitate to contact us for any questions or concerns.   Total time spent on today's visit was 31 minutes dedicated to this patient today, preparing to see patient, examining the patient, ordering tests and/or medications and counseling the patient, documenting clinical information in the EHR or other health record, independently interpreting results and communicating results to the patient/family, discussing treatment and goals, answering patient's questions and coordinating care.  Cc:  Cain Saupe, MD  Marlowe Kays 03/06/2022 12:05 PM

## 2022-04-07 ENCOUNTER — Encounter: Payer: Self-pay | Admitting: Psychology

## 2022-05-26 ENCOUNTER — Ambulatory Visit: Payer: Medicare Other | Admitting: Psychology

## 2022-05-26 ENCOUNTER — Ambulatory Visit (INDEPENDENT_AMBULATORY_CARE_PROVIDER_SITE_OTHER): Payer: Medicare Other | Admitting: Psychology

## 2022-05-26 ENCOUNTER — Encounter: Payer: Self-pay | Admitting: Psychology

## 2022-05-26 DIAGNOSIS — M8580 Other specified disorders of bone density and structure, unspecified site: Secondary | ICD-10-CM | POA: Insufficient documentation

## 2022-05-26 DIAGNOSIS — F411 Generalized anxiety disorder: Secondary | ICD-10-CM | POA: Diagnosis not present

## 2022-05-26 DIAGNOSIS — G3184 Mild cognitive impairment, so stated: Secondary | ICD-10-CM | POA: Diagnosis not present

## 2022-05-26 DIAGNOSIS — E785 Hyperlipidemia, unspecified: Secondary | ICD-10-CM | POA: Insufficient documentation

## 2022-05-26 DIAGNOSIS — R4189 Other symptoms and signs involving cognitive functions and awareness: Secondary | ICD-10-CM

## 2022-05-26 NOTE — Progress Notes (Signed)
   Psychometrician Note   Cognitive testing was administered to Mary Taylor by Milana Kidney, B.S. (psychometrist) under the supervision of Dr. Christia Reading, Ph.D., licensed psychologist on 05/26/2022. Mary Taylor did not appear overtly distressed by the testing session per behavioral observation or responses across self-report questionnaires. Rest breaks were offered.    The battery of tests administered was selected by Dr. Christia Reading, Ph.D. with consideration to Mary Taylor's current level of functioning, the nature of her symptoms, emotional and behavioral responses during interview, level of literacy, observed level of motivation/effort, and the nature of the referral question. This battery was communicated to the psychometrist. Communication between Dr. Christia Reading, Ph.D. and the psychometrist was ongoing throughout the evaluation and Dr. Christia Reading, Ph.D. was immediately accessible at all times. Dr. Christia Reading, Ph.D. provided supervision to the psychometrist on the date of this service to the extent necessary to assure the quality of all services provided.    Mary Taylor will return within approximately 1-2 weeks for an interactive feedback session with Dr. Melvyn Novas at which time her test performances, clinical impressions, and treatment recommendations will be reviewed in detail. Mary Taylor understands she can contact our office should she require our assistance before this time.  A total of 150 minutes of billable time were spent face-to-face with Mary Taylor by the psychometrist. This includes both test administration and scoring time. Billing for these services is reflected in the clinical report generated by Dr. Christia Reading, Ph.D.  This note reflects time spent with the psychometrician and does not include test scores or any clinical interpretations made by Dr. Melvyn Novas. The full report will follow in a separate note.

## 2022-05-26 NOTE — Progress Notes (Unsigned)
NEUROPSYCHOLOGICAL EVALUATION [place]. [facility] Department of Neurology  Date of Evaluation: May 26, 2022  Reason for Referral:   Mary Taylor is a 66 y.o. right-handed African-American female referred by Marlowe Kays, PA-C, to characterize her current cognitive functioning and assist with diagnostic clarity and treatment planning in the context of subjective cognitive decline.   Assessment and Plan:   Clinical Impression(s): Mary Taylor's pattern of performance is suggestive of prominent impairment surrounding all aspects of learning and memory. Additional impairments were exhibited across executive functioning and receptive language, while performance variability was exhibited across processing speed, attention/concentration, semantic fluency, confrontation naming, and visuospatial abilities. Phonemic fluency represented the only cognitive domain which exhibited consistently appropriate performances relative to age-matched peers. Despite ongoing cognitive impairment, Mary Taylor continues to work and denied ongoing difficulties surrounding ADLs. Her daughter did not contradict this but did note that family might look over her shoulder and provide assistance surrounding medication management at times. Given general functional independence, she does not meet formal diagnostic criteria for dementia. 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   The etiology for ongoing impairment is unclear. However, there is reason to have greater concern surrounding an early-onset Alzheimer's disease presentation given memory performances. Across memory tasks, Mary Taylor did not benefit from repeated exposure to information, was essentially amnestic across all memory tasks after a brief delay, and performed very  poorly across yes/no recognition trials. 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 Deficits surrounding confrontation naming and visuospatial abilities, along with a discrepancy in verbal fluency scores (semantic generally worse than phonemic), are all further consistent with expected disease progression. Despite the rarity of this condition at Mary Taylor age, continued medical monitoring will be important moving forward.   Behaviorally, Mary Taylor does not display characteristics of frontotemporal lobar degeneration (including primary progressive aphasia presentations). She also does not display characteristics of Lewy body disease or another illness along the parkinsonian spectrum. While recent neuroimaging revealed mild microvascular ischemic disease, this may be largely age-appropriate and certainly would not account for profound memory impairment. Likewise, while mild anxiety can influence cognitive abilities, I do not feel that this would account for the extent of ongoing cognitive dysfunction.  Recommendations: A repeat neuropsychological evaluation in 12-18 months (or sooner if functional decline is noted) is recommended to assess the trajectory of future cognitive decline should it occur. This will also aid in future efforts towards improved diagnostic clarity.  Mary Taylor has already been prescribed a medication aimed to address memory loss and concerns surrounding Alzheimer's disease (i.e., donepezil/Aricept). She is encouraged to continue taking this medication as prescribed. It is important to highlight that this medication has been shown to slow functional decline in some individuals. There is no current treatment which can stop or reverse cognitive decline when caused by a neurodegenerative illness.   It appears that she is prescribed alprazolam/Xanax for ongoing anxiety. This medication  has well established cognitive side effects. I would recommend that she speak with her prescribing physician to replace this medication with another anti-anxiety medication that is taken regularly rather than on an as-needed basis.   Performance across neurocognitive testing is not a strong predictor of an individual's safety operating a motor vehicle. With that being said, I do have driving  concerns given ongoing cognitive deficits, especially those involving processing speed, attention/concentration, executive functioning, and visuospatial abilities. I would recommend that she complete a formalized driving evaluation to better understand her safety behind the wheel. Should her family wish to pursue a formalized driving evaluation, they could reach out to the following agencies: [employer] in [place]: [phone] [facility]: [phone] [facility]: [phone] [facility]: [phone] or [phone]  Should there be further progression of current deficits over time, she is unlikely to regain any independent living skills lost. Therefore, it is recommended that she remain as involved as possible in all aspects of household chores, finances, and medication management, with supervision to ensure adequate performance. She will likely benefit from the establishment and maintenance of a routine in order to maximize her functional abilities over time.  It will be important for Mary Taylor to have another person with her when in situations where she may need to process information, weigh the pros and cons of different options, and make decisions, in order to ensure that she fully understands and recalls all information to be considered.  Mary Taylor is encouraged to attend to lifestyle factors for brain health (e.g., regular physical exercise, good nutrition habits, regular participation in cognitively-stimulating activities, and general stress  management techniques), which are likely to have benefits for both emotional adjustment and cognition. In fact, in addition to promoting good general health, regular exercise incorporating aerobic activities (e.g., brisk walking, jogging, cycling, etc.) has been demonstrated to be a very effective treatment for depression and stress, with similar efficacy rates to both antidepressant medication and psychotherapy. Optimal control of vascular risk factors (including safe cardiovascular exercise and adherence to dietary recommendations) is encouraged. Continued participation in activities which provide mental stimulation and social interaction is also recommended.   Important information should be provided to Mary Taylor in written format in all instances. This information should be placed in a highly frequented and easily visible location within her home to promote recall. External strategies such as written notes in a consistently used memory journal, visual and nonverbal auditory cues such as a calendar on the refrigerator or appointments with alarm, such as on a cell phone, can also help maximize recall.  To address problems with processing speed, she may wish to consider:   -Ensuring that she is alerted when essential material or instructions are being presented   -Adjusting the speed at which new information is presented   -Allowing for more time in comprehending, processing, and responding in conversation  To address problems with fluctuating attention and executive dysfunction, she may wish to consider:   -Avoiding external distractions when needing to concentrate   -Limiting exposure to fast paced environments with multiple sensory demands   -Writing down complicated information and using checklists   -Attempting and completing one task at a time (i.e., no multi-tasking)   -Verbalizing aloud each step of a task to maintain focus   -Reducing the amount of information considered at one  time  Review of Records:   Mary Taylor was seen by [facility] Marlowe Kays, PA-C) on 02/02/2022 for an evaluation of memory loss. Memory concerns were said to be present for the prior six months. Examples included Mary Taylor repeating herself and having a harder time remembering past conversations. She also described a few isolated instances where she would wake disoriented and not know the date or time. Her daughter reported memory concerns for the prior three months, similar to what is described above.  She continues to work and ADLs were described as intact. Performance on a brief cognitive screening instrument (MOCA) was 9/30; however, there was suspicion for ongoing anxiety to have artificially impacted testing. Performance on a follow-up MOCA on 03/05/2022 was 16/30. Ultimately, Mary Taylor was referred for a comprehensive neuropsychological evaluation to characterize her cognitive abilities and to assist with diagnostic clarity and treatment planning.  EEG on 02/12/2022 was normal. Brain MRI on 02/16/2022 revealed mild parenchymal volume loss without lobar predominance, as well as mild microvascular ischemic disease.   Past Medical History:  Diagnosis Date   Acute upper respiratory infection 05/22/2014   Office Spirometry 12/18/16-WNL-FVC 3.07/103%, FEV1 2.40/102%, ratio 0.78, FEF 25-75% 2.40/106%   Generalized anxiety disorder    Hyperlipidemia    Hypertensive disorder 01/25/2018   Pt is not taking Amlodipine which was prescribed to her pt states she was informed by previous physician as Taylor as she keep bp below 140 she was ok   Hypothyroidism    Migraine    Muscle spasms of neck 09/19/2018   Musculoskeletal pain 03/15/2008   Osteopenia    Osteoporosis 01/25/2018   Bone Density/[place]/ 2017 or 2018   Plantar fasciitis of right foot 06/01/2019   Sarcoidosis    Seasonal and perennial allergic rhinitis 03/14/2008   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   Vitamin D  deficiency 01/25/2018    Past Surgical History:  Procedure Laterality Date   BREAST CYST EXCISION  1970   THYROID SURGERY     goiter   TUBAL LIGATION  1987    Current Outpatient Medications:    albuterol (VENTOLIN HFA) 108 (90 Base) MCG/ACT inhaler, Inhale 1-2 puffs into the lungs every 6 (six) hours as needed for wheezing or shortness of breath., Disp: 1 each, Rfl: 0   ALPRAZolam (XANAX) 0.25 MG tablet, Take 0.25 mg by mouth at bedtime as needed for anxiety., Disp: , Rfl:    amLODipine (NORVASC) 5 MG tablet, amlodipine 5 mg tablet  1 TABLET ORALLY ONCE A DAY, DR REDDY BRAND 90 DAYS, Disp: , Rfl:    cholecalciferol (VITAMIN D) 1000 UNITS tablet, Take 1,000 Units by mouth daily., Disp: , Rfl:    Cyanocobalamin (VITAMIN B 12 PO), Take by mouth as needed., Disp: , Rfl:    cyclobenzaprine (FLEXERIL) 10 MG tablet, Take 10 mg by mouth as needed for muscle spasms., Disp: , Rfl:    donepezil (ARICEPT) 10 MG tablet, Take half tablet (5 mg) daily for 2 weeks, then increase to the full tablet at 10 mg daily, Disp: 30 tablet, Rfl: 11   fluticasone (FLONASE) 50 MCG/ACT nasal spray, Place 2 sprays into both nostrils daily., Disp: , Rfl:    SYNTHROID 75 MCG tablet, Take 1 tablet by mouth daily., Disp: , Rfl:   Clinical Interview:   The following information was obtained during a clinical interview with Mary Taylor and her daughter prior to cognitive testing.  Cognitive Symptoms: Decreased short-term memory: Endorsed. Primary difficulties surrounded word finding where she cannot recall certain words she wishes to use in conversation. In turn, this creates escalating anxiety and diminished performance. She reported some trouble recalling names and her daughter reported infrequent instances where she or other family members will need to repeat conversations. Difficulties were said to have been present for the past six or so months. They were unclear on overall trajectory as Mary Taylor noted no  difficulties in work settings.  Decreased Taylor-term memory: Denied. Decreased attention/concentration: Denied. Reduced processing speed: Denied. Difficulties with executive  functions: Denied. They also denied trouble with impulsivity or any more significant personality changes.  Difficulties with emotion regulation: Denied. Difficulties with receptive language: Denied. Difficulties with word finding: Endorsed. Decreased visuoperceptual ability: Denied.  Difficulties completing ADLs: Denied.  Additional Medical History: History of traumatic brain injury/concussion: Denied. History of stroke: Denied. History of seizure activity: Denied. History of known exposure to toxins: Denied. Symptoms of chronic pain: Denied. Acutely, she did describe a "pinch" in her shoulder, as well as some numbness/tingling across her toes.  Experience of frequent headaches/migraines: Denied. Frequent instances of dizziness/vertigo: Denied.  Sensory changes: She wears glasses with benefit. Other sensory changes/difficulties (e.g., hearing, taste, smell) were denied.  Balance/coordination difficulties: Denied. She also denied any recent falls.  Other motor difficulties: Denied. Some shakiness was said to be present during periods of heightened anxiety.   Sleep History: Estimated hours obtained each night: 8 hours.  Difficulties falling asleep: Denied. Difficulties staying asleep: Denied. Feels rested and refreshed upon awakening: Endorsed.  History of snoring: Unclear.  History of waking up gasping for air: Denied. Witnessed breath cessation while asleep: Denied.  History of vivid dreaming: Denied. Excessive movement while asleep: Denied. Instances of acting out her dreams: Denied.  Psychiatric/Behavioral Health History: Depression: Denied. She did report ongoing frustration surrounding word finding difficulties and her perception of some cognitive decline. She described her faith as her primary coping  mechanism. Current or remote suicidal ideation, intent, or plan was denied.   Anxiety: Endorsed. Symptoms are chronic and generalized in nature. She reported taking anti-anxiety medications (Xanax) as needed but fairly regularly with benefit. Mania: Denied. Trauma History: Denied. Visual/auditory hallucinations: Denied. Delusional thoughts: Denied.  Tobacco: Denied. Alcohol: She denied current alcohol consumption as well as a history of problematic alcohol abuse or dependence.  Recreational drugs: Denied.  Family History: Problem Relation Age of Onset   Hypertension Mother    Kidney failure Mother    Heart disease Father    Heart disease Brother    Prostate cancer Brother    Heart disease Sister    This information was confirmed by Ms. Senaida Ores.  Academic/Vocational History: Highest level of educational attainment: 12 years. She graduated from high school and described herself as an average (A/B/C) student in academic settings. No relative weaknesses were identified.  History of developmental delay: Denied. History of grade repetition: Denied. Enrollment in special education courses: Denied. History of LD/ADHD: Denied.  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 She also denied any recent negative performance reviews.   Evaluation Results:   Behavioral Observations: Mary Taylor was accompanied by her daughter, arrived to her appointment on time, and was appropriately dressed and groomed. She appeared alert and oriented. Observed gait and station were within normal limits. Gross motor functioning appeared intact upon informal observation and no abnormal movements (e.g., tremors) were noted. Her affect was somewhat anxious during interview and did range appropriately given the subject being discussed. She did express some anxiety  surrounding upcoming testing procedures. Spontaneous speech was fluent. Mild word finding difficulties were observed during interview. Thought processes were coherent, organized, and normal in content. Insight into her cognitive difficulties appeared poor as Mary Taylor largely denied cognitive concerns outside of word finding yet objective testing revealed fairly diffuse and significant impairment. She likely does not appreciate the full extent of ongoing cognitive limitations.   During testing, sustained attention was appropriate. She did exhibit confusion and  trouble understanding task instructions at times, especially across tasks more complex in nature. Task engagement was adequate and she persisted when challenged. Anxiety was notable during early portions of testing. This did appear to dissipate as the evaluation progressed. Overall, Ms. Gal was cooperative with the clinical interview and subsequent testing procedures.   Adequacy of Effort: The validity of neuropsychological testing is limited by the extent to which the individual being tested may be assumed to have exerted adequate effort during testing. Mary Taylor expressed her intention to perform to the best of her abilities and exhibited adequate task engagement and persistence. Scores across stand-alone and embedded performance validity measures were variable. Poor performances are believed to be due to true memory and other cognitive impairment rather than poor engagement or attempts to perform poorly. As such, the results of the current evaluation are believed to be a generally valid representation of Mary Taylor current cognitive functioning.  Test Results: Ms. Kretzschmar was largely oriented at the time of the current evaluation. Points were lost for her being unable to name the current clinic.  Intellectual abilities based upon educational and vocational attainment were estimated to be in the below average to average  range. Premorbid abilities were estimated to be within the well below average range based upon a single-word reading test.   Processing speed was variable, ranging from the well below average to average normative ranges. Basic attention was also variable, ranging from the well below average to average normative ranges. More complex attention (e.g., working memory) was well below average to below average. Executive functioning was exceptionally low to below average. She also performed in the well below average range across a task assessing safety and judgment. Points were lost largely due to the provision of overly vague responses rather than unsafe answers.  Assessed receptive language abilities were well below average. Difficulties were primarily observed understanding complex sentence structure and following multi-step commands. She also had mild trouble sequencing commands. Assessed expressive language was variable. Phonemic fluency was below average to average, semantic fluency was exceptionally low to average, and confrontation naming was below average to average across a screening instrument but in the exceptionally low range across a more comprehensive task.   Assessed visuospatial/visuoconstructional abilities were variable, ranging from the exceptionally low to average normative ranges. Across her drawing of a clock, she exhibited some spacing abnormalities. She also exhibited confusion when asked to place the clock hands, ultimately writing the numbers 10 and 11 outside of the clock and made no attempt at clock hands. Her copy of a complex figure was adequate.      Learning (i.e., encoding) of novel verbal information was exceptionally low. Spontaneous delayed recall (i.e., retrieval) of previously learned information was also exceptionally low. Retention rates were 0% across a story learning task, 0% across a list learning task, and 18% across a figure drawing task. Performance across recognition  tasks was exceptionally low, suggesting negligible evidence for information consolidation.   Results of emotional screening instruments suggested that recent symptoms of generalized anxiety were in the mild range, while symptoms of depression were within normal limits. A screening instrument assessing recent sleep quality suggested the presence of minimal sleep dysfunction.  Tables of Scores:   Note: This summary of test scores accompanies the interpretive report and should not be considered in isolation without reference to the appropriate sections in the text. Descriptors are based on appropriate normative data and may be adjusted based on clinical judgment. Terms such as "Within Normal Limits"  and "Outside Normal Limits" are used when a more specific description of the test score cannot be determined.       Percentile - Normative Descriptor > 98 - Exceptionally High 91-97 - Well Above Average 75-90 - Above Average 25-74 - Average 9-24 - Below Average 2-8 - Well Below Average < 2 - Exceptionally Low       Validity:   DESCRIPTOR       ACS Word Choice: --- --- Outside Normal Limits  Dot Counting Test: --- --- Within Normal Limits  RBANS Effort Index: --- --- Outside Normal Limits       Orientation:      Raw Score Percentile   NAB Orientation, Form 1 27/29 --- ---       Cognitive Screening:      Raw Score Percentile   SLUMS: 8/30 --- ---       RBANS, Form A: Standard Score/ Scaled Score Percentile   Total Score 52 <1 Exceptionally Low  Immediate Memory 40 <1 Exceptionally Low    List Learning 1 <1 Exceptionally Low    Story Memory 1 <1 Exceptionally Low  Visuospatial/Constructional 78 7 Well Below Average    Figure Copy 8 25 Average    Line Orientation 10/20 3-9 Well Below Average  Language 74 4 Well Below Average    Picture Naming 9/10 17-25 Below Average to Average    Semantic Fluency 1 <1 Exceptionally Low  Attention 64 1 Exceptionally Low    Digit Span 5 5 Well Below  Average    Coding 4 2 Well Below Average  Delayed Memory 40 <1 Exceptionally Low    List Recall 0/10 <2 Exceptionally Low    List Recognition 12/20 <2 Exceptionally Low    Story Recall 1 <1 Exceptionally Low    Story Recognition 5/12 1-2 Exceptionally Low    Figure Recall 3 1 Exceptionally Low    Figure Recognition 0/8 1 Exceptionally Low        Intellectual Functioning:      Standard Score Percentile   Barona Formula Estimated Premorbid IQ 88 21 Below Average        Standard Score Percentile   Test of Premorbid Functioning: 73 4 Well Below Average       Attention/Executive Function:     Trail Making Test (TMT): Raw Score (T Score) Percentile     Part A 53 secs.,  0 errors (41) 18 Below Average    Part B 230 secs.,  3 errors (39) 14 Below Average         Scaled Score Percentile   WAIS-IV Digit Span: 5 5 Well Below Average    Forward 8 25 Average    Backward 5 5 Well Below Average    Sequencing 6 9 Below Average        Scaled Score Percentile   WAIS-IV Similarities: 2 <1 Exceptionally Low       D-KEFS Color-Word Interference Test: Raw Score (Scaled Score) Percentile     Color Naming 40 secs. (7) 16 Below Average    Word Reading 26 secs. (9) 37 Average    Inhibition 176 secs. (1) <1 Exceptionally Low      Total Errors 26 errors (1) <1 Exceptionally Low    Inhibition/Switching 88 secs. (9) 37 Average      Total Errors 17 errors (1) <1 Exceptionally Low       D-KEFS Verbal Fluency Test: Raw Score (Scaled Score) Percentile     Letter Total Correct 27 (  7) 16 Below Average    Category Total Correct 29 (8) 25 Average    Category Switching Total Correct 3 (1) <1 Exceptionally Low    Category Switching Accuracy 1 (1) <1 Exceptionally Low      Total Set Loss Errors 7 (4) 2 Well Below Average      Total Repetition Errors 11 (2) <1 Exceptionally Low       NAB Executive Functions Module, Form 1: T Score Percentile     Judgment 36 8 Well Below Average       Language:      Raw  Score Percentile   Sentence Repetition: 11/22 1 Exceptionally Low       Verbal Fluency Test: Raw Score (T Score) Percentile     Phonemic Fluency (FAS) 27 (47) 38 Average    Animal Fluency 10 (37) 9 Below Average        NAB Language Module, Form 1: T Score Percentile     Auditory Comprehension 28 2 Well Below Average    Naming 17/31 (19) <1 Exceptionally Low       Visuospatial/Visuoconstruction:      Raw Score Percentile   Clock Drawing: 4/10 --- Impaired        Scaled Score Percentile   WAIS-IV Block Design: 6 9 Below Average  WAIS-IV Matrix Reasoning: 5 5 Well Below Average       Mood and Personality:      Raw Score Percentile   Beck Depression Inventory - II: 1 --- Within Normal Limits  PROMIS Anxiety Questionnaire: 16 --- Mild       Additional Questionnaires:      Raw Score Percentile   PROMIS Sleep Disturbance Questionnaire: 9 --- None to Slight   Informed Consent and Coding/Compliance:   The current evaluation represents a clinical evaluation for the purposes previously outlined by the referral source and is in no way reflective of a forensic evaluation.   Ms. Nass was provided with a verbal description of the nature and purpose of the present neuropsychological evaluation. Also reviewed were the foreseeable risks and/or discomforts and benefits of the procedure, limits of confidentiality, and mandatory reporting requirements of this provider. The patient was given the opportunity to ask questions and receive answers about the evaluation. Oral consent to participate was provided by the patient.   This evaluation was conducted by Christia Reading, Ph.D., ABPP-CN, board certified clinical neuropsychologist. Ms. Grout completed a clinical interview with Dr. Melvyn Novas, billed as one unit [phone], and 150 minutes of cognitive testing and scoring, billed as one unit [phone] and four additional units 96139. Psychometrist Milana Kidney, B.S., assisted Dr. Melvyn Novas with test  administration and scoring procedures. As a separate and discrete service, Dr. Melvyn Novas spent a total of 160 minutes in interpretation and report writing billed as one unit [phone] and two units 96133.

## 2022-05-27 ENCOUNTER — Encounter: Payer: Self-pay | Admitting: Psychology

## 2022-06-05 ENCOUNTER — Ambulatory Visit (INDEPENDENT_AMBULATORY_CARE_PROVIDER_SITE_OTHER): Payer: Medicare Other | Admitting: Psychology

## 2022-06-05 DIAGNOSIS — F411 Generalized anxiety disorder: Secondary | ICD-10-CM | POA: Diagnosis not present

## 2022-06-05 DIAGNOSIS — G3184 Mild cognitive impairment, so stated: Secondary | ICD-10-CM | POA: Diagnosis not present

## 2022-06-05 NOTE — Progress Notes (Signed)
   Neuropsychology Feedback Session Mary Taylor. [facility]  Reason for Referral:   Mary Taylor is a 66 y.o. right-handed African-American female referred by Sharene Butters, PA-C, to characterize her current cognitive functioning and assist with diagnostic clarity and treatment planning in the context of subjective cognitive decline.   Feedback:   Mary Taylor completed a comprehensive neuropsychological evaluation on 05/26/2022. Please refer to that encounter for the full report and recommendations. Briefly, results suggested prominent impairment surrounding all aspects of learning and memory. Additional impairments were exhibited across executive functioning and receptive language, while performance variability was exhibited across processing speed, attention/concentration, semantic fluency, confrontation naming, and visuospatial abilities. The etiology for ongoing impairment is unclear. However, there is reason to have greater concern surrounding an early-onset Alzheimer's disease presentation given memory performances. Across memory tasks, Mary Taylor did not benefit from repeated exposure to information, was essentially amnestic across all memory tasks after a brief delay, and performed very poorly across yes/no recognition trials. 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 Deficits surrounding confrontation naming and visuospatial abilities, along with a discrepancy in verbal fluency scores (semantic generally worse than phonemic), are all further consistent with expected disease progression. Despite the rarity of this condition at Mary Taylor age, continued medical monitoring will be important moving forward.   Mary Taylor was accompanied by her daughter during the current feedback session. Content of the current session focused on the results of her  neuropsychological evaluation. Mary Taylor was given the opportunity to ask questions and her questions were answered. She was encouraged to reach out should additional questions arise. A copy of her report was provided at the conclusion of the visit.      40 minutes were spent conducting the current feedback session with Mary Taylor, billed as one unit [phone].

## 2022-08-13 ENCOUNTER — Other Ambulatory Visit: Payer: Self-pay | Admitting: Internal Medicine

## 2022-08-13 ENCOUNTER — Ambulatory Visit
Admission: RE | Admit: 2022-08-13 | Discharge: 2022-08-13 | Disposition: A | Discharge status: Home / Self Care | Payer: Medicare Other | Source: Ambulatory Visit | Attending: Internal Medicine | Admitting: Internal Medicine

## 2022-08-13 DIAGNOSIS — R634 Abnormal weight loss: Secondary | ICD-10-CM

## 2022-08-25 DIAGNOSIS — D869 Sarcoidosis, unspecified: Secondary | ICD-10-CM | POA: Diagnosis not present

## 2022-08-25 DIAGNOSIS — R9389 Abnormal findings on diagnostic imaging of other specified body structures: Secondary | ICD-10-CM | POA: Diagnosis not present

## 2022-08-25 DIAGNOSIS — R634 Abnormal weight loss: Secondary | ICD-10-CM | POA: Diagnosis not present

## 2022-08-25 DIAGNOSIS — I10 Essential (primary) hypertension: Secondary | ICD-10-CM | POA: Diagnosis not present

## 2022-09-07 ENCOUNTER — Encounter: Payer: Self-pay | Admitting: Physician Assistant

## 2022-09-07 ENCOUNTER — Encounter: Payer: Self-pay | Admitting: Psychology

## 2022-09-07 ENCOUNTER — Ambulatory Visit: Payer: Medicare HMO | Admitting: Physician Assistant

## 2022-09-07 VITALS — BP 163/74 | HR 73 | Resp 18 | Wt 125.0 lb

## 2022-09-07 DIAGNOSIS — G3184 Mild cognitive impairment, so stated: Secondary | ICD-10-CM

## 2022-09-07 DIAGNOSIS — R413 Other amnesia: Secondary | ICD-10-CM | POA: Diagnosis not present

## 2022-09-07 NOTE — Progress Notes (Signed)
Assessment/Plan:   Amnestic mild cognitive impairment  Mary Taylor is a very pleasant 67 y.o. RH female with a history of hypertension, hyperlipidemia, nonalcoholic liver disease, hypothyroidism, sarcoidosis, social anxiety, history of migraine headaches, and a diagnosis of Mild Cognitive Impairment as per Neuropsych evaluation presenting today in follow-up for evaluation of memory loss. Patient is not on antidementia medication. Personally reviewed MRI  of the brain on 02/16/2022 was remarkable for mild global parenchymal volume loss without discernible lobar predominance or disproportionately hypoccampal atrophy and mild chronic white matter microangiopathy.  MMSE today is 23/30, stable from prior.  Patient is on donepezil 10 mg daily, tolerating well.  She admits to missing some of her doses however.   Recommendations:   Follow up in 6 months. Continue B12 supplementation Continue to control mood as per PCP Recommend good control of cardiovascular risk factors. Repeat Neuropsych evaluation in 10 to 16 months for clarity of the diagnosis and disease trajectory Continue donepezil 10 mg daily, side effects discussed.  Recommend family members to monitor patient's compliance with the medication.    Subjective:   This patient is here alone Previous records as well as any outside records available were reviewed prior to todays visit.   Patient was last seen on 02/02/2022.      Any changes in memory since last visit? "No changes, nothing odd". May encounter once in a while some forgetfulness with recent conversations or names, however she reports that she is hardly surrounded by people.  She continues to work and denies any difficulties at the job. repeats oneself?  "Not on a regular basis " Disoriented when walking into a room?  Patient denies  Leaving objects in unusual places?  Patient denies   Wandering behavior?   denies   Any personality changes since last visit?   denies    Any worsening depression?: denies   Hallucinations or paranoia?  denies   Seizures?   denies    Any sleep changes?  Denies  vivid dreams, REM behavior or sleepwalking   Sleep apnea?   denies   Any hygiene concerns?   denies   Independent of bathing and dressing?  Endorsed  Does the patient needs help with medications?  Patient is in charge, she admits to missing occasionally some doses of the medication. Who is in charge of the finances?  Patient is in charge     Any changes in appetite?  denies     Patient have trouble swallowing?  denies   Does the patient cook?  No Any headaches?    denies   Vision changes? denies Chronic back pain  denies   Ambulates with difficulty?    denies   Recent falls or head injuries?    denies     Unilateral weakness, numbness or tingling?  He has a chronic history of with neuropathy, but she "have been taking lemon and B12, and it helps "  Any tremors?  denies   Any anosmia?    denies   Any incontinence of urine?  denies   Any bowel dysfunction?  denies      Patient lives  with her daughter  Does the patient drive? I recently passed the street I was supposed to go, but this is not very often. 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  Patient daughter reports that she noticed her mother having some memory difficulties for about  6 months, when she began to repeat herself more frequently and having a hard time remembering conversations.  She even bought over-the-counter memory supplement that did not help.  However, the patient reported to incidents that got her concerned. One was 1 month ago, when she woke up, and she was disoriented, she did not know the time, date, but as soon as she saw the paperwork on top of the table (of her new job), she knew where she was.  This happened once again.  Of note, she states that when "I have something to do,I am okay.  It is  when I do not have anything to do that I become like this ".  She denies any recent tics, infections, COVID, she denies any prior history of seizures.  Her daughter states "that the only thing that helped her it was a little lorazepam, then she was okay ".  When she has the "blank moments ", she denies loss of consciousness, or any other seizure symptoms. Patient lives with daughter, who noticed that she began repeating herself about 3 months ago, she says that the anxiety medicine helps, and this is worse in the morning.   Disoriented when walking into a room?  Endorsed, as above Leaving objects in unusual places?  Patient denies   Ambulates  with difficulty?   Patient denies   Recent falls?  Patient denies   Any head injuries?  Patient denies   History of seizures?   Patient denies, and she also denies any body aching after these "blank", any involuntary secretions, involuntary urination, falls, metallic or blood taste.   Wandering behavior?  Patient denies   Patient drives?   She continues to drive, denies any difficulty doing so.  She denies getting lost.   Any mood changes such irritability agitation?  Since moving, to be with her family here in [place], the patient may be experiencing some situational depression.  She never was diagnosed with depression or treated for it.  However, she admits to feeling "a little lost in the new environment.  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 Hallucinations?  Patient denies   Paranoia?  Patient denies   Patient reports that he sleeps well without vivid dreams, REM behavior or sleepwalking   History of sleep apnea?  Patient denies   Any hygiene concerns?  Patient denies   Independent of bathing and dressing?  Endorsed  Does the patient needs help with medications?  Patient denies.   Who is in charge of the finances?  Patient is in charge Any changes in appetite?  Patient denies    Patient have trouble swallowing? Patient denies   Does the patient cook?  Patient denies   Any kitchen accidents such as leaving the stove on? Patient denies   No vision changes Any focal numbness or tingling?  She has a history of bilateral foot neuropathy, followed by PCP. Chronic back pain Patient denies   Unilateral weakness?  Patient denies   Any tremors?  Patient denies   Any history of anosmia?  Patient denies   Any incontinence of urine?  Patient denies   Any bowel dysfunction?   Patient denies   History of heavy alcohol intake?  Patient denies   History of heavy tobacco use?  Patient denies   Family history of dementia?  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 Has just retired from normal life  in August 2022, moved in September 2022 from [place] due to stressful situation.   Recent Labs B12  358, Vit D 68.2,   Neuropsychological consultation 06/05/2022  "Briefly, results suggested prominent impairment surrounding all aspects of learning and memory. Additional impairments were exhibited across executive functioning and receptive language, while performance variability was exhibited across processing speed, attention/concentration, semantic fluency, confrontation naming, and visuospatial abilities. The etiology for ongoing impairment is unclear. However, there is reason to have greater concern surrounding an early-onset Alzheimer's disease presentation given memory performances. Across memory tasks, Ms. Sonier did not benefit from repeated exposure to information, was essentially amnestic across all memory tasks after a brief delay, and performed very poorly across yes/no recognition trials. 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 Deficits surrounding confrontation naming and visuospatial abilities, along with a discrepancy in verbal fluency scores (semantic generally  worse than phonemic), are all further consistent with expected disease progression. Despite the rarity of this condition at Ms. Soliman's age, continued medical monitoring will be important moving forward.    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   Generalized anxiety disorder    Hyperlipidemia    Hypertensive disorder 01/25/2018   Pt is not taking Amlodipine which was prescribed to her pt states she was informed by previous physician as long as she keep bp below 140 she was ok   Hypothyroidism    Migraine    Muscle spasms of neck 09/19/2018   Musculoskeletal pain 03/15/2008   Osteopenia    Osteoporosis 01/25/2018   Bone Density/Arnot/ 2017 or 2018   Plantar fasciitis of right foot 06/01/2019   Sarcoidosis    Seasonal and perennial allergic rhinitis 03/14/2008   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   ALPRAZolam (XANAX) 0.25 MG tablet Take 0.25 mg by mouth at bedtime as needed for anxiety.   amLODipine (NORVASC) 5 MG tablet amlodipine 5 mg tablet  1 TABLET ORALLY ONCE A DAY, DR REDDY BRAND 90 DAYS   cholecalciferol (VITAMIN D) 1000 UNITS tablet Take 1,000 Units by mouth daily.   Cyanocobalamin (VITAMIN B 12 PO) Take by mouth as needed.   cyclobenzaprine (FLEXERIL) 10 MG tablet Take 10 mg by mouth as needed for muscle spasms.    donepezil (ARICEPT) 10 MG tablet Take half tablet (5 mg) daily for 2 weeks, then increase to the full tablet at 10 mg daily   fluticasone (FLONASE) 50 MCG/ACT nasal spray Place 2 sprays into both nostrils daily.   SYNTHROID 75 MCG tablet Take 1 tablet by mouth daily.   No facility-administered encounter medications on file as of 09/07/2022.     Objective:     PHYSICAL EXAMINATION:    VITALS:   Vitals:   09/07/22 0859  BP: (!) 163/74  Pulse: 73  Resp: 18  SpO2: 100%  Weight: 125 lb (56.7 kg)    GEN:  The patient appears stated age and is in NAD. HEENT:  Normocephalic, atraumatic.   Neurological examination:  General: NAD, well-groomed, appears stated age. Orientation: The patient is alert. Oriented to person, place and not to date Cranial nerves: There is good facial symmetry.The speech is fluent and clear. No aphasia or dysarthria. Fund of knowledge is appropriate. Recent memory impaired and remote memory is normal.  Attention and concentration are normal.  Able to name objects and repeat phrases.  Hearing is intact to conversational tone.   Delayed recall 0/3 Sensation: Sensation is intact to light touch throughout Motor: Strength is at least antigravity x4. Tremors: none  DTR's 2/4 in UE/LE      03/06/2022   12:00 PM 02/02/2022    9:00 AM  Montreal Cognitive Assessment   Visuospatial/ Executive (0/5) 3 1  Naming (0/3) 2 2  Attention: Read list of digits (0/2) 1 2  Attention: Read list of letters (0/1) 1 1  Attention: Serial 7 subtraction starting at 100 (0/3) 0 0  Language: Repeat phrase (0/2) 1 1  Language : Fluency (0/1) 1 0  Abstraction (0/2) 1 0  Delayed Recall (0/5) 1 0  Orientation (0/6) 5 2  Total 16 9  Adjusted Score (based on education) 16 9       09/07/2022   10:00 AM  MMSE - Mini Mental State Exam  Orientation to time 3  Orientation to Place 5  Registration 3  Attention/ Calculation 3  Recall 0  Language- name 2 objects 2  Language- repeat 1   Language- follow 3 step command 3  Language- read & follow direction 1  Write a sentence 1  Copy design 1  Total score 23       Movement examination: Tone: There is normal tone in the UE/LE Abnormal movements:  no tremor.  No myoclonus.  No asterixis.   Coordination:  There is no decremation with RAM's. Normal finger to nose  Gait and Station: The patient has no difficulty arising out of a deep-seated chair without the use of the hands. The patient's stride length is good.  Gait is cautious and narrow.   Thank you for allowing [place] the opportunity to participate in the care of this nice patient. Please do not hesitate to contact us for any questions or concerns.   Total time spent on today's visit was 31 minutes dedicated to this patient today, preparing to see patient, examining the patient, ordering tests and/or medications and counseling the patient, documenting clinical information in the EHR or other health record, independently interpreting results and communicating results to the patient/family, discussing treatment and goals, answering patient's questions and coordinating care.  Cc:  Cain Saupe, MD  Marlowe Kays 09/07/2022 10:57 AM

## 2022-09-07 NOTE — Patient Instructions (Signed)
It was a pleasure to see you today at our office.   Recommendations:  Follow up in 6  months Repeat the neurocognitive testing in 10 months to 1 year for follow up on memory Continue Donepezil as prescribed.  Continue B12  Monitor your blood pressure   Whom to call:  Memory  decline, memory medications: Call our office [phone]   For psychiatric meds, mood meds: Please have your primary care physician manage these medications.      For assessment of decision of mental capacity and competency:  Call Dr. Anthoney Harada, geriatric psychiatrist at [phone]  For guidance in geriatric dementia issues please call [facility] [phone]    If you have any severe symptoms of a stroke, or other severe issues such as confusion,severe chills or fever, etc call 911 or go to the ER as you may need to be evaluated further       RECOMMENDATIONS FOR ALL PATIENTS WITH MEMORY PROBLEMS: 1. Continue to exercise (Recommend 30 minutes of walking everyday, or 3 hours every week) 2. Increase social interactions - continue going to [place] and enjoy social gatherings with friends and family 3. Eat healthy, avoid fried foods and eat more fruits and vegetables 4. Maintain adequate blood pressure, blood sugar, and blood cholesterol level. Reducing the risk of stroke and cardiovascular disease also helps promoting better memory. 5. Avoid stressful situations. Live a simple life and avoid aggravations. Organize your time and prepare for the next day in anticipation. 6. Sleep well, avoid any interruptions of sleep and avoid any distractions in the bedroom that may interfere with adequate sleep quality 7. Avoid sugar, avoid sweets as there is a strong link between excessive sugar intake, diabetes, and cognitive impairment We discussed the Mediterranean diet, which has been shown to help patients reduce the risk of progressive memory disorders and reduces cardiovascular risk. This includes  eating fish, eat fruits and green leafy vegetables, nuts like almonds and hazelnuts, walnuts, and also use olive oil. Avoid fast foods and fried foods as much as possible. Avoid sweets and sugar as sugar use has been linked to worsening of memory function.  There is always a concern of gradual progression of memory problems. If this is the case, then we may need to adjust level of care according to patient needs. Support, both to the patient and caregiver, should then be put into place.    FALL PRECAUTIONS: Be cautious when walking. Scan the area for obstacles that may increase the risk of trips and falls. When getting up in the mornings, sit up at the edge of the bed for a few minutes before getting out of bed. Consider elevating the bed at the head end to avoid drop of blood pressure when getting up. Walk always in a well-lit room (use night lights in the walls). Avoid area rugs or power cords from appliances in the middle of the walkways. Use a walker or a cane if necessary and consider physical therapy for balance exercise. Get your eyesight checked regularly.  FINANCIAL OVERSIGHT: Supervision, especially oversight when making financial decisions or transactions is also recommended.  HOME SAFETY: Consider the safety of the kitchen when operating appliances like stoves, microwave oven, and blender. Consider having supervision and share cooking responsibilities until no longer able to participate in those. Accidents with firearms and other hazards in the house should be identified and addressed as well.   ABILITY TO BE LEFT ALONE: If patient is unable to contact 911 operator, consider  using [employer], or when the need is there, arrange for someone to stay with patients. Smoking is a fire hazard, consider supervision or cessation. Risk of wandering should be assessed by caregiver and if detected at any point, supervision and safe proof recommendations should be instituted.  MEDICATION SUPERVISION:  Inability to self-administer medication needs to be constantly addressed. Implement a mechanism to ensure safe administration of the medications.   DRIVING: Regarding driving, in patients with progressive memory problems, driving will be impaired. We advise to have someone else do the driving if trouble finding directions or if minor accidents are reported. Independent driving assessment is available to determine safety of driving.   If you are interested in the driving assessment, you can contact the following:  [facility] [employer] [facility] in [place]  [place] [phone]  [place]  [facility] [phone] or [phone]

## 2022-09-10 ENCOUNTER — Other Ambulatory Visit: Payer: Self-pay | Admitting: Internal Medicine

## 2022-09-10 DIAGNOSIS — D869 Sarcoidosis, unspecified: Secondary | ICD-10-CM

## 2022-09-18 ENCOUNTER — Institutional Professional Consult (permissible substitution) (HOSPITAL_BASED_OUTPATIENT_CLINIC_OR_DEPARTMENT_OTHER): Payer: Medicare HMO | Admitting: Pulmonary Disease

## 2022-10-08 ENCOUNTER — Encounter: Payer: Medicare Other | Admitting: Psychology

## 2022-10-15 ENCOUNTER — Ambulatory Visit (HOSPITAL_BASED_OUTPATIENT_CLINIC_OR_DEPARTMENT_OTHER): Payer: Medicare HMO | Admitting: Pulmonary Disease

## 2022-10-15 ENCOUNTER — Encounter (HOSPITAL_BASED_OUTPATIENT_CLINIC_OR_DEPARTMENT_OTHER): Payer: Self-pay | Admitting: Pulmonary Disease

## 2022-10-15 VITALS — BP 110/60 | HR 80 | Ht 66.0 in | Wt 120.4 lb

## 2022-10-15 DIAGNOSIS — D869 Sarcoidosis, unspecified: Secondary | ICD-10-CM

## 2022-10-15 NOTE — Patient Instructions (Addendum)
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

## 2022-10-15 NOTE — Progress Notes (Signed)
Subjective:   PATIENT ID: Mary Taylor GENDER: female DOB: Nov 19, 1955, MRN: [id number]  Chief Complaint  Patient presents with   Consult    Unsure why she's here, not sure if she had sarcoidosis or not. Doesn't want to admit she really smoked in high school start or end    Reason for Visit: New consult for sarcoid  Mary Taylor is a 67 year old female with sarcoidosis, allergic rhinitis, hypothyroidism, amnestic mild cognitive impairment, HTN, HLD, osteoporosis, vitamin D deficiency and GAD who presents as a new consult for sarcoid.  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 Denies limitations in activity. Denies fever, fatigue, weight loss, visual disturbance, dyspnea, cough, shortness of breath, wheezing, palpitations, abdominal pain, numbness, tingling, lightheadedness, syncope. Denies family history of sarcoid. Denies personal history of asthma. Wears glasses but does not go to eye doctor annually.  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   Generalized anxiety disorder    Hyperlipidemia    Hypertensive disorder 01/25/2018   Pt is not taking Amlodipine which was prescribed to her pt states she was informed by previous physician as long as she keep bp below 140 she was ok   Hypothyroidism    Migraine    Muscle spasms of neck 09/19/2018   Musculoskeletal pain 03/15/2008   Osteopenia    Osteoporosis 01/25/2018   Bone Density/[place]/ 2017 or 2018   Plantar fasciitis of right  foot 06/01/2019   Sarcoidosis    Seasonal and perennial allergic rhinitis 03/14/2008   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   Vitamin D deficiency 01/25/2018     Family History  Problem Relation Age of Onset   Hypertension Mother    Kidney failure Mother    Heart disease Father    Heart disease Brother    Prostate cancer Brother    Heart disease Sister      Social History   Occupational History   Occupation: maint. tech  Tobacco Use   Smoking status: Never   Smokeless tobacco: Never  Substance and Sexual Activity   Alcohol use: No   Drug use: No   Sexual activity: Not on file    Allergies  Allergen Reactions   Ibuprofen     Stomach pains   Lexapro [Escitalopram Oxalate]     Hoarse     Outpatient Medications Prior to Visit  Medication Sig Dispense Refill   amLODipine (NORVASC) 5 MG tablet amlodipine 5 mg tablet  1 TABLET ORALLY ONCE A DAY, DR REDDY BRAND 90 DAYS     cholecalciferol (VITAMIN D) 1000 UNITS tablet Take 1,000 Units by mouth daily.     Cyanocobalamin (VITAMIN B 12 PO) Take by mouth as needed.     SYNTHROID 75 MCG tablet Take 1 tablet by mouth daily.     albuterol (VENTOLIN HFA) 108 (90 Base) MCG/ACT inhaler Inhale 1-2 puffs into the lungs every 6 (six) hours as needed for wheezing or shortness of breath. (Patient not taking: Reported on 10/15/2022) 1 each 0   ALPRAZolam (XANAX) 0.25 MG tablet Take 0.25 mg by mouth at  bedtime as needed for anxiety. (Patient not taking: Reported on 10/15/2022)     cyclobenzaprine (FLEXERIL) 10 MG tablet Take 10 mg by mouth as needed for muscle spasms. (Patient not taking: Reported on 10/15/2022)     donepezil (ARICEPT) 10 MG tablet Take half tablet (5 mg) daily for 2 weeks, then increase to the full tablet at 10 mg daily 30 tablet 11   fluticasone (FLONASE) 50 MCG/ACT nasal spray Place 2 sprays into both nostrils daily. (Patient not taking: Reported on 10/15/2022)     No facility-administered medications prior to visit.     Review of Systems  Constitutional:  Negative for chills, diaphoresis, fever, malaise/fatigue and weight loss.  HENT:  Negative for congestion.   Respiratory:  Negative for cough, hemoptysis, sputum production, shortness of breath and wheezing.   Cardiovascular:  Negative for chest pain, palpitations and leg swelling.     Objective:   Vitals:   10/15/22 0915  BP: 110/60  Pulse: 80  SpO2: 99%  Weight: 120 lb 6.4 oz (54.6 kg)  Height: 5' 6"$  (1.676 m)   SpO2: 99 % O2 Device: None (Room air)  Physical Exam: General: Well-appearing, no acute distress HENT: [place], AT Eyes: EOMI, no scleral icterus Respiratory: Clear to auscultation bilaterally.  No crackles, wheezing or rales Cardiovascular: RRR, -M/R/G, no JVD Extremities:-Edema,-tenderness Neuro: AAO x4, CNII-XII grossly intact Psych: Normal mood, normal affect  Data Reviewed:  Imaging: CT Chest 03/08/08 - Mediastinal and hilar adenopathy with calcification in the right hilum. Scattered granulomas MR Brain 02/15/22 Mild global parenchymal volume loss without discernible lobar predominance or disproportionate hippocampal atrophy and mild chronic white matter microangiopathy CXR 10/15/22 - Multiple pulmonary nodules R>L, new compared to 01/14/16.  PFT: 12/18/16  Spirometry - normal  Labs:    Latest Ref Rng & Units 02/02/2022    9:11 AM  CBC  WBC 4.0 - 10.5 K/uL 5.6   Hemoglobin 12.0 - 15.0 g/dL 13.9   Hematocrit 36.0 - 46.0 % 42.9   Platelets 150.0 - 400.0 K/uL 228.0        No data to display             No data to display            Assessment & Plan:   Discussion: 67 year old female with sarcoidosis, allergic rhinitis, hypothyroidism, amnestic mild cognitive impairment, HTN, HLD, osteoporosis, vitamin D deficiency and GAD who presents as a new consult for sarcoid.  We discussed the clinical course of sarcoid and management including serial PFTs, labs, eye exam, and EKG and chest imaging if indicated. If  symptoms suggest sarcoid flare in the future, we would manage with steroids +/- biologics. Currently asymptomatic. Addressed questions and concerns regarding her suspected diagnosis of sarcoid. At this time we do not have evidence of active disease so would not warrant treatment. We would also need a tissue diagnosis before starting any immunosuppressants.   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  --Annual PFTs.  Last spirometry 2018 --Annual ophthalmology exam.  --Baseline EKG next visit --Routine labs as needed: CBC with diff, CMET, 1, 25 and 25 hydroxy vitamin D  Health Maintenance Immunization History  Administered Date(s) Administered   Influenza Split 05/24/2016   Moderna Sars-Covid-2 Vaccination 11/03/2019, 12/04/2019   CT Lung Screen - not qualified. Insufficient tobacco history  Orders Placed This Encounter  Procedures  CBC With Differential   Comp Met (CMET)   Angiotensin converting enzyme   Vitamin D 1,25 dihydroxy   Vitamin D 25 hydroxy   Ambulatory referral to Rheumatology    Referral Priority:   Routine    Referral Type:   Consultation    Referral Reason:   Specialty Services Required    Requested Specialty:   Rheumatology    Number of Visits Requested:   1  No orders of the defined types were placed in this encounter.   Return in about 2 months (around 12/14/2022).  I have spent a total time of 60-minutes on the day of the appointment reviewing prior documentation, coordinating care and discussing medical diagnosis and plan with the patient/family. Imaging, labs and tests included in this note have been reviewed and interpreted independently by me.  [place], MD [facility] 10/15/2022 2:48 PM  Office Number [phone]

## 2022-10-19 ENCOUNTER — Encounter: Payer: Medicare Other | Admitting: Psychology

## 2022-10-22 LAB — VITAMIN D 25 HYDROXY (VIT D DEFICIENCY, FRACTURES): Vit D, 25-Hydroxy: 37.5 ng/mL (ref 30.0–100.0)

## 2022-10-22 LAB — VITAMIN D 1,25 DIHYDROXY
Vitamin D 1, 25 (OH)2 Total: 49 pg/mL
Vitamin D2 1, 25 (OH)2: <10 pg/mL
Vitamin D3 1, 25 (OH)2: 49 pg/mL

## 2022-11-17 ENCOUNTER — Ambulatory Visit
Admission: RE | Admit: 2022-11-17 | Discharge: 2022-11-17 | Disposition: A | Discharge status: Home / Self Care | Payer: Medicare HMO | Source: Ambulatory Visit | Attending: Internal Medicine | Admitting: Internal Medicine

## 2022-11-17 DIAGNOSIS — I700 Atherosclerosis of aorta: Secondary | ICD-10-CM | POA: Diagnosis not present

## 2022-11-17 DIAGNOSIS — J984 Other disorders of lung: Secondary | ICD-10-CM | POA: Diagnosis not present

## 2022-11-17 DIAGNOSIS — R918 Other nonspecific abnormal finding of lung field: Secondary | ICD-10-CM | POA: Diagnosis not present

## 2022-11-17 DIAGNOSIS — R634 Abnormal weight loss: Secondary | ICD-10-CM | POA: Diagnosis not present

## 2022-11-17 DIAGNOSIS — D869 Sarcoidosis, unspecified: Secondary | ICD-10-CM

## 2022-11-17 MED ORDER — IOPAMIDOL (ISOVUE-300) INJECTION 61%: 75.0000 mL | Freq: Once | INTRAVENOUS | Status: DC | PRN

## 2022-11-17 MED ORDER — IOPAMIDOL (ISOVUE-300) INJECTION 61%
75.0000 mL | Freq: Once | INTRAVENOUS | Status: AC | PRN
  Administered 2022-11-17: 65 mL via INTRAVENOUS

## 2022-11-24 DIAGNOSIS — D869 Sarcoidosis, unspecified: Secondary | ICD-10-CM | POA: Diagnosis not present

## 2022-11-24 DIAGNOSIS — I10 Essential (primary) hypertension: Secondary | ICD-10-CM | POA: Diagnosis not present

## 2022-11-24 DIAGNOSIS — R918 Other nonspecific abnormal finding of lung field: Secondary | ICD-10-CM | POA: Diagnosis not present

## 2022-11-24 DIAGNOSIS — E039 Hypothyroidism, unspecified: Secondary | ICD-10-CM | POA: Diagnosis not present

## 2022-11-24 DIAGNOSIS — E46 Unspecified protein-calorie malnutrition: Secondary | ICD-10-CM | POA: Diagnosis not present

## 2022-11-24 DIAGNOSIS — N1831 Chronic kidney disease, stage 3a: Secondary | ICD-10-CM | POA: Diagnosis not present

## 2022-11-24 DIAGNOSIS — J309 Allergic rhinitis, unspecified: Secondary | ICD-10-CM | POA: Diagnosis not present

## 2022-11-24 DIAGNOSIS — R69 Illness, unspecified: Secondary | ICD-10-CM | POA: Diagnosis not present

## 2022-12-18 ENCOUNTER — Ambulatory Visit (HOSPITAL_BASED_OUTPATIENT_CLINIC_OR_DEPARTMENT_OTHER): Payer: Medicare HMO | Admitting: Pulmonary Disease

## 2022-12-18 ENCOUNTER — Encounter (HOSPITAL_BASED_OUTPATIENT_CLINIC_OR_DEPARTMENT_OTHER): Payer: Self-pay | Admitting: Pulmonary Disease

## 2022-12-18 VITALS — BP 136/74 | HR 69 | Temp 98.1°F | Ht 66.0 in | Wt 120.4 lb

## 2022-12-18 DIAGNOSIS — R918 Other nonspecific abnormal finding of lung field: Secondary | ICD-10-CM

## 2022-12-18 DIAGNOSIS — Z9229 Personal history of other drug therapy: Secondary | ICD-10-CM

## 2022-12-18 NOTE — Patient Instructions (Signed)
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

## 2022-12-18 NOTE — Progress Notes (Unsigned)
Subjective:   PATIENT ID: Mary Taylor GENDER: female DOB: 01-16-1956, MRN: [id number]  Chief Complaint  Patient presents with   Follow-up    Follow up. Patient has no complaints.     Reason for Visit: Follow-up sarcoid  Ms. Ellyssa Zagal is a 67 year old female with sarcoidosis, allergic rhinitis, hypothyroidism, amnestic mild cognitive impairment, HTN, HLD, osteoporosis, vitamin D deficiency and GAD who presents for follow-up for sarcoid.  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 Denies limitations in activity. Denies fever, fatigue, weight loss, visual disturbance, dyspnea, cough, shortness of breath, wheezing, palpitations, abdominal pain, numbness, tingling, lightheadedness, syncope. Denies family history of sarcoid. Denies personal history of asthma. Wears glasses but does not go to eye doctor annually.  12/18/22 Presents with different daughter this clinic visit. Patient does not recall discussing sarcoid on last visit. She is a poor historian at baseline. Does not recall receiving handout on sarcoid with known MCI. Daughter wishes to discuss CT scan. Patient with no symptoms. Denies cough, shortness of breath or wheezing. No other symptoms that she is concerned with. 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, research (physical sciences) x 30 years No pets  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   Generalized anxiety disorder    Hyperlipidemia    Hypertensive disorder 01/25/2018   Pt is not taking Amlodipine which was prescribed to her pt states she was informed by previous physician as long as she keep bp below 140 she was ok    Hypothyroidism    Migraine    Muscle spasms of neck 09/19/2018   Musculoskeletal pain 03/15/2008   Osteopenia    Osteoporosis 01/25/2018   Bone Density// 2017 or 2018   Plantar fasciitis of right foot 06/01/2019   Sarcoidosis    Seasonal and perennial allergic rhinitis 03/14/2008   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   Vitamin D deficiency 01/25/2018     Family History  Problem Relation Age of Onset   Hypertension Mother    Kidney failure Mother    Heart disease Father    Heart disease Brother    Prostate cancer Brother    Heart disease Sister      Social History   Occupational History   Occupation: maint. tech  Tobacco Use   Smoking status: Never   Smokeless tobacco: Never  Substance and Sexual Activity   Alcohol use: No   Drug use: No   Sexual activity: Not on file    Allergies  Allergen Reactions   Ibuprofen     Stomach pains   Lexapro [Escitalopram Oxalate]     Hoarse     Outpatient Medications Prior to Visit  Medication Sig Dispense Refill   amLODipine (NORVASC) 5 MG tablet amlodipine 5 mg tablet  1 TABLET ORALLY ONCE A DAY, DR REDDY BRAND 90 DAYS     cholecalciferol (VITAMIN D) 1000 UNITS tablet Take 1,000 Units by mouth daily.     Cyanocobalamin (VITAMIN B 12 PO) Take by mouth as needed.     donepezil (ARICEPT) 10 MG tablet Take half tablet (5 mg) daily for 2 weeks, then increase to the full tablet at 10 mg daily 30 tablet 11   SYNTHROID  75 MCG tablet Take 1 tablet by mouth daily.     albuterol (VENTOLIN HFA) 108 (90 Base) MCG/ACT inhaler Inhale 1-2 puffs into the lungs every 6 (six) hours as needed for wheezing or shortness of breath. (Patient not taking: Reported on 10/15/2022) 1 each 0   ALPRAZolam (XANAX) 0.25 MG tablet Take 0.25 mg by mouth at bedtime as needed for anxiety. (Patient not taking: Reported on 10/15/2022)     cyclobenzaprine (FLEXERIL) 10 MG tablet Take 10 mg by mouth as needed for muscle spasms. (Patient not taking: Reported on  10/15/2022)     fluticasone (FLONASE) 50 MCG/ACT nasal spray Place 2 sprays into both nostrils daily. (Patient not taking: Reported on 10/15/2022)     No facility-administered medications prior to visit.    Review of Systems  Constitutional:  Negative for chills, diaphoresis, fever, malaise/fatigue and weight loss.  HENT:  Negative for congestion.   Respiratory:  Negative for cough, hemoptysis, sputum production, shortness of breath and wheezing.   Cardiovascular:  Negative for chest pain, palpitations and leg swelling.     Objective:   Vitals:   12/18/22 0931  BP: 136/74  Pulse: 69  Temp: 98.1 F (36.7 C)  TempSrc: Oral  SpO2: 100%  Weight: 120 lb 6.4 oz (54.6 kg)  Height: 5\' 6"  (1.676 m)   SpO2: 100 % O2 Device: None (Room air)  Physical Exam: General: Well-appearing, no acute distress HENT: [place], AT Eyes: EOMI, no scleral icterus Respiratory: Clear to auscultation bilaterally.  No crackles, wheezing or rales Cardiovascular: RRR, -M/R/G, no JVD Extremities:-Edema,-tenderness Neuro: AAO x4, CNII-XII grossly intact Psych: Normal mood, normal affect  Data Reviewed:  Imaging: CT Chest 03/08/08 - Mediastinal and hilar adenopathy with calcification in the right hilum. Scattered granulomas MR Brain 02/15/22 Mild global parenchymal volume loss without discernible lobar predominance or disproportionate hippocampal atrophy and mild chronic white matter microangiopathy CXR 10/15/22 - Multiple pulmonary nodules R>L, new compared to 01/14/16. CT Chest 11/17/22 - Multiple calcified and noncalcified nodules bilaterally with largest in LLL measuring 1 cm and RLL 1.2 cm  PFT: 12/18/16  Spirometry - normal  Labs:    Latest Ref Rng & Units 02/02/2022    9:11 AM  CBC  WBC 4.0 - 10.5 K/uL 5.6   Hemoglobin 12.0 - 15.0 g/dL 13.0   Hematocrit 86.5 - 46.0 % 42.9   Platelets 150.0 - 400.0 K/uL 228.0        No data to display              No data to display              Assessment & Plan:   Discussion: 67 year old female with sarcoidosis, allergic rhinitis, hypothyroidism, amnestic mild cognitive impairment, HTN, HLD, osteoporosis, vitamin D deficiency and GAD who presents for follow-up for sarcoid.  We re-discussed the clinical course of sarcoid and management including serial PFTs, labs, eye exam, and EKG and chest imaging if indicated. If symptoms suggest sarcoid flare in the future, we would manage with steroids +/- biologics. Currently asymptomatic. Re-addressed questions and concerns regarding her suspected diagnosis of sarcoid. At this time we do not have evidence of active disease so would not warrant treatment. We would also need a tissue diagnosis before starting any immunosuppressants. Will obtain labs for baseline.  We reviewed CT imaging which overall is suggestive of sarcoid. Pulmonary nodules in lower lobes >1 cm require surveillance to rule out possible malignancy thought this is likely sarcoid. If nodules stable,  will increase interval between scans.   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  --Annual PFTs.  Last spirometry 2018 --Annual ophthalmology exam.  --Baseline EKG next visit --Routine labs as needed: CBC with diff, CMET, 1, 25 and 25 hydroxy vitamin D  Health Maintenance Immunization History  Administered Date(s) Administered   H1N1 09/13/2008   Influenza Split 06/07/2008, 05/06/2009, 05/18/2011, 05/24/2016, 07/24/2017   Influenza,inj,Quad PF,6+ Mos 09/08/2018, 04/24/2019   Influenza,inj,Quad PF,6-35 Mos 05/06/2020   Moderna Sars-Covid-2 Vaccination 11/03/2019,  12/04/2019, 07/31/2020, 12/19/2020   PFIZER(Purple Top)SARS-COV-2 Vaccination 06/17/2021   Pneumococcal Polysaccharide-23 10/25/2018   Tdap 05/06/2009, 10/25/2018   Zoster Recombinat (Shingrix) 09/19/2018   Zoster, Live 09/07/2018, 05/19/2021, 11/08/2022   CT Lung Screen - not qualified. Insufficient tobacco history  Orders Placed This Encounter  Procedures   CT Chest Wo Contrast    Standing Status:   Future    Standing Expiration Date:   12/18/2023    Scheduling Instructions:     ORDER for end of June. Schedule clinic appointment after imaging.    Order Specific Question:   Preferred imaging location?    Answer:   [facility]   CBC w/Diff    Standing Status:   Future    Number of Occurrences:   1    Standing Expiration Date:   12/18/2023   Comp Met (CMET)    Standing Status:   Future    Number of Occurrences:   1    Standing Expiration Date:   12/18/2023   Angiotensin converting enzyme    Standing Status:   Future    Number of Occurrences:   1    Standing Expiration Date:   12/18/2023  No orders of the defined types were placed in this encounter.   Return in about 10 weeks (around 02/26/2023) for after CT scan.  I have spent a total time of 39-minutes on the day of the appointment including chart review, data review, collecting history, coordinating care and discussing medical diagnosis and plan with the patient/family. Past medical history, allergies, medications were reviewed. Pertinent imaging, labs and tests included in this note have been reviewed and interpreted independently by me.  [place] Mechele Collin, MD [facility] 12/18/2022 9:53 AM  Office Number [phone]

## 2022-12-19 LAB — ANGIOTENSIN CONVERTING ENZYME: Angio Convert Enzyme: 36 U/L (ref 14–82)

## 2022-12-19 LAB — CBC WITH DIFFERENTIAL/PLATELET
Basophils Absolute: 0 10*3/uL (ref 0.0–0.2)
Basos: 1 %
EOS (ABSOLUTE): 0.1 10*3/uL (ref 0.0–0.4)
Eos: 2 %
Hematocrit: 40.1 % (ref 34.0–46.6)
Hemoglobin: 13.1 g/dL (ref 11.1–15.9)
Immature Grans (Abs): 0 10*3/uL (ref 0.0–0.1)
Immature Granulocytes: 0 %
Lymphocytes Absolute: 0.9 10*3/uL (ref 0.7–3.1)
Lymphs: 13 %
MCH: 28.1 pg (ref 26.6–33.0)
MCHC: 32.7 g/dL (ref 31.5–35.7)
MCV: 86 fL (ref 79–97)
Monocytes Absolute: 0.7 10*3/uL (ref 0.1–0.9)
Monocytes: 11 %
Neutrophils Absolute: 4.7 10*3/uL (ref 1.4–7.0)
Neutrophils: 73 %
Platelets: 242 10*3/uL (ref 150–450)
RBC: 4.66 x10E6/uL (ref 3.77–5.28)
RDW: 13.1 % (ref 11.7–15.4)
WBC: 6.5 10*3/uL (ref 3.4–10.8)

## 2022-12-19 LAB — COMPREHENSIVE METABOLIC PANEL
ALT: 20 IU/L (ref 0–32)
AST: 27 IU/L (ref 0–40)
Albumin/Globulin Ratio: 1.9 (ref 1.2–2.2)
Albumin: 4.4 g/dL (ref 3.9–4.9)
Alkaline Phosphatase: 81 IU/L (ref 44–121)
BUN/Creatinine Ratio: 15 (ref 12–28)
BUN: 16 mg/dL (ref 8–27)
Bilirubin Total: 0.3 mg/dL (ref 0.0–1.2)
CO2: 23 mmol/L (ref 20–29)
Calcium: 9.7 mg/dL (ref 8.7–10.3)
Chloride: 103 mmol/L (ref 96–106)
Creatinine, Ser: 1.1 mg/dL — ABNORMAL HIGH (ref 0.57–1.00)
Globulin, Total: 2.3 g/dL (ref 1.5–4.5)
Glucose: 90 mg/dL (ref 70–99)
Potassium: 4.7 mmol/L (ref 3.5–5.2)
Sodium: 140 mmol/L (ref 134–144)
Total Protein: 6.7 g/dL (ref 6.0–8.5)
eGFR: 55 mL/min/{1.73_m2} — ABNORMAL LOW (ref >59)

## 2022-12-24 ENCOUNTER — Encounter (HOSPITAL_BASED_OUTPATIENT_CLINIC_OR_DEPARTMENT_OTHER): Payer: Self-pay | Admitting: Pulmonary Disease

## 2023-01-07 ENCOUNTER — Encounter: Payer: Medicare Other | Admitting: Psychology

## 2023-01-14 ENCOUNTER — Encounter: Payer: Medicare Other | Admitting: Psychology

## 2023-02-03 ENCOUNTER — Ambulatory Visit (INDEPENDENT_AMBULATORY_CARE_PROVIDER_SITE_OTHER): Payer: Medicare HMO

## 2023-02-03 ENCOUNTER — Encounter: Payer: Self-pay | Admitting: Internal Medicine

## 2023-02-03 ENCOUNTER — Ambulatory Visit: Payer: Medicare HMO | Attending: Internal Medicine | Admitting: Internal Medicine

## 2023-02-03 ENCOUNTER — Ambulatory Visit: Payer: Medicare HMO

## 2023-02-03 VITALS — BP 129/71 | HR 66 | Resp 12 | Ht 66.0 in | Wt 117.0 lb

## 2023-02-03 DIAGNOSIS — F4010 Social phobia, unspecified: Secondary | ICD-10-CM

## 2023-02-03 DIAGNOSIS — G3184 Mild cognitive impairment, so stated: Secondary | ICD-10-CM | POA: Diagnosis not present

## 2023-02-03 DIAGNOSIS — M79641 Pain in right hand: Secondary | ICD-10-CM | POA: Diagnosis not present

## 2023-02-03 DIAGNOSIS — M79642 Pain in left hand: Secondary | ICD-10-CM

## 2023-02-03 DIAGNOSIS — N1831 Chronic kidney disease, stage 3a: Secondary | ICD-10-CM

## 2023-02-03 DIAGNOSIS — R202 Paresthesia of skin: Secondary | ICD-10-CM | POA: Insufficient documentation

## 2023-02-03 DIAGNOSIS — D869 Sarcoidosis, unspecified: Secondary | ICD-10-CM

## 2023-02-03 DIAGNOSIS — E785 Hyperlipidemia, unspecified: Secondary | ICD-10-CM | POA: Insufficient documentation

## 2023-02-03 DIAGNOSIS — G250 Essential tremor: Secondary | ICD-10-CM | POA: Insufficient documentation

## 2023-02-03 DIAGNOSIS — M25552 Pain in left hip: Secondary | ICD-10-CM

## 2023-02-03 DIAGNOSIS — G8929 Other chronic pain: Secondary | ICD-10-CM | POA: Insufficient documentation

## 2023-02-03 DIAGNOSIS — K760 Fatty (change of) liver, not elsewhere classified: Secondary | ICD-10-CM

## 2023-02-03 DIAGNOSIS — M62838 Other muscle spasm: Secondary | ICD-10-CM | POA: Diagnosis not present

## 2023-02-03 DIAGNOSIS — M19049 Primary osteoarthritis, unspecified hand: Secondary | ICD-10-CM

## 2023-02-03 DIAGNOSIS — J309 Allergic rhinitis, unspecified: Secondary | ICD-10-CM | POA: Insufficient documentation

## 2023-02-03 HISTORY — DX: Chronic kidney disease, stage 3a: N18.31

## 2023-02-03 HISTORY — DX: Social phobia, unspecified: F40.10

## 2023-02-03 HISTORY — DX: Primary osteoarthritis, unspecified hand: M19.049

## 2023-02-03 HISTORY — DX: Fatty (change of) liver, not elsewhere classified: K76.0

## 2023-02-03 HISTORY — DX: Paresthesia of skin: R20.2

## 2023-02-03 NOTE — Progress Notes (Signed)
Office Visit Note  Patient: Mary Taylor             Date of Birth: Nov 25, 1955           MRN: [id number]             PCP: Lorenda Ishihara, MD Referring: Luciano Cutter, MD Visit Date: 02/03/2023  Subjective:  New Patient (Initial Visit) (Patient states she has pain on the left side in her back that started after she had injection around her left hip.)   History of Present Illness: Mary Taylor is a 67 y.o. female referred here for evaluation of sarcoidosis with joint pain complaint in multiple areas.  History is largely obtained from chart review with patient having amnestic MCI does not recall well onset and progression of symptoms and treatments for these.  She reports pain in both hands and pain around the low back and into the left leg.  Not associated with too much swelling or overlying skin change.  She states she had some type of injection to the left hip for this pain that was helpful but then felt the pain more going into the mid back and down the left lateral leg.  She intermittently experiences numbness and tingling type sensations down the full length of the leg.  Does not recall much detail about the previous sarcoidosis diagnosis.  Does not seem she had a specific history of inflammatory arthritis.  Does not complain about shortness of breath or cough.  No complaint of new skin rashes or discoloration.  She takes Tylenol as needed is partially helpful.   Activities of Daily Living:  Patient reports morning stiffness for 0 minute.   Patient Denies nocturnal pain.  Difficulty dressing/grooming: Denies Difficulty climbing stairs: Denies Difficulty getting out of chair: Denies Difficulty using hands for taps, buttons, cutlery, and/or writing: Denies  Review of Systems  Constitutional:  Negative for fatigue.  HENT:  Negative for mouth sores and mouth dryness.   Eyes:  Negative for dryness.  Respiratory:  Negative for shortness of breath.    Cardiovascular:  Negative for chest pain and palpitations.  Gastrointestinal:  Negative for blood in stool, constipation and diarrhea.  Endocrine: Positive for increased urination.  Genitourinary:  Negative for involuntary urination.  Musculoskeletal:  Positive for joint pain and joint pain. Negative for gait problem, joint swelling, myalgias, muscle weakness, morning stiffness, muscle tenderness and myalgias.  Skin:  Negative for color change, rash, hair loss and sensitivity to sunlight.  Allergic/Immunologic: Negative for susceptible to infections.  Neurological:  Negative for dizziness and headaches.  Hematological:  Negative for swollen glands.  Psychiatric/Behavioral:  Positive for depressed mood. Negative for sleep disturbance. The patient is nervous/anxious.     PMFS History:  Patient Active Problem List   Diagnosis Date Noted   Chronic kidney disease, stage 3a (HCC) 02/03/2023   Allergic rhinitis 02/03/2023   Dyslipidemia 02/03/2023   Essential tremor 02/03/2023   Fatty liver disease, nonalcoholic 02/03/2023   Left leg paresthesias 02/03/2023   Other chronic pain 02/03/2023   Primary osteoarthritis, unspecified hand 02/03/2023   Social anxiety disorder 02/03/2023   Hyperlipidemia 05/26/2022   Osteopenia 05/26/2022   Generalized anxiety disorder 05/26/2022   Amnestic MCI (mild cognitive impairment with memory loss) 05/26/2022   Plantar fasciitis of right foot 06/01/2019   Muscle spasms of neck 09/19/2018   Hypertensive disorder 01/25/2018   Hypothyroidism 01/25/2018   Osteoporosis 01/25/2018   Vitamin D deficiency 01/25/2018   Tinnitus of both  ears 12/18/2016   Musculoskeletal pain 03/15/2008   Sarcoidosis 03/14/2008   Seasonal and perennial allergic rhinitis 03/14/2008    Past Medical History:  Diagnosis Date   Acute upper respiratory infection 05/22/2014   Office Spirometry 12/18/16-WNL-FVC 3.07/103%, FEV1 2.40/102%, ratio 0.78, FEF 25-75% 2.40/106%   Amnestic MCI  (mild cognitive impairment with memory loss) 05/26/2022   Generalized anxiety disorder    Hyperlipidemia    Hypertensive disorder 01/25/2018   Pt is not taking Amlodipine which was prescribed to her pt states she was informed by previous physician as long as she keep bp below 140 she was ok   Hypothyroidism    Migraine    Muscle spasms of neck 09/19/2018   Musculoskeletal pain 03/15/2008   Osteopenia    Osteoporosis 01/25/2018   Bone Density/[place]/ 2017 or 2018   Plantar fasciitis of right foot 06/01/2019   Sarcoidosis    Seasonal and perennial allergic rhinitis 03/14/2008   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   Vitamin D deficiency 01/25/2018    Family History  Problem Relation Age of Onset   Hypertension Mother    Kidney failure Mother    Heart disease Father    Heart disease Brother    Prostate cancer Brother    Heart disease Sister    Past Surgical History:  Procedure Laterality Date   BREAST CYST EXCISION  1970   THYROID SURGERY     goiter   TUBAL LIGATION  1987   Social History   Social History Narrative   Right handed   Drinks coffee   One story home   Immunization History  Administered Date(s) Administered   Fluad Quad(high Dose 65+) 05/11/2022   H1N1 09/13/2008   Influenza Split 06/07/2008, 05/06/2009, 05/18/2011, 05/24/2016, 07/24/2017   Influenza, High Dose Seasonal PF 05/10/2019, 06/23/2021, 05/25/2022   Influenza,inj,Quad PF,6+ Mos 09/08/2018, 04/24/2019   Influenza,inj,Quad PF,6-35 Mos 05/06/2020   Moderna Sars-Covid-2 Vaccination 11/03/2019, 12/04/2019, 07/31/2020, 12/19/2020   PFIZER(Purple Top)SARS-COV-2 Vaccination 06/17/2021   PPD Test 09/11/2021   Pneumococcal Polysaccharide-23 10/25/2018   Tdap 05/06/2009, 10/25/2018   Zoster Recombinat (Shingrix) 09/19/2018   Zoster, Live 09/07/2018, 05/19/2021, 11/08/2022     Objective: Vital Signs: BP 129/71 (BP Location: Right Arm, Patient Position: Sitting, Cuff Size: Normal)   Pulse 66   Resp 12    Ht 5\' 6"  (1.676 m)   Wt 117 lb (53.1 kg)   BMI 18.88 kg/m    Physical Exam Cardiovascular:     Rate and Rhythm: Normal rate and regular rhythm.  Pulmonary:     Effort: Pulmonary effort is normal.     Breath sounds: Normal breath sounds.  Skin:    General: Skin is warm and dry.     Findings: No rash.  Psychiatric:     Comments: Mood normal Difficulty with recalling specifics on many questions      Musculoskeletal Exam:  Shoulders full ROM no tenderness or swelling Elbows full ROM no tenderness or swelling Wrists full ROM no tenderness or swelling Fingers Heberden's nodes on both hands mild lateral deviations and DIP joints Left lumbar paraspinal muscle tenderness to pressure, lateral hip mildly with some extension on lateral thigh over femur   Investigation: No additional findings.  Imaging: XR Hand 2 View Left  Result Date: 02/18/2023 X-ray left hand 2 views Radiocarpal joint space appears normal.  Some cystic changes present in the carpal bones and mild degenerative change of first CMC joint.  Moderate at first IP.  MCP joints appear normal.  Moderate PIP degenerative changes with more severe DIP joint involvement.  Irregular but in some cases complete joint space narrowing at second and third DIPs with lateral deviation.  No erosions seen.  There is generalized osteopenia. Impression Severe distal finger joint osteoarthritis, generalized osteopenia consistent with history of osteoporosis  XR Hand 2 View Right  Result Date: 02/18/2023 X-ray right hand 2 views Radiocarpal joint space appears normal.  Moderately severe first CMC joint degenerative changes with lateral bone spurring and mild subluxation.  First MCP and IP joint with narrowing osteophytes and compensatory hyperextension.  MCP joints appear normal.  Moderate degenerative changes throughout PIP and DIP joints most severe at the third DIP.  No erosions seen.  There is generalized osteopenia. Impression Moderate to  severe osteoarthritis at first [facility] and in distal finger joints, generalized osteopenia probably consistent with systemic osteoporosis  XR Pelvis 1-2 Views  Result Date: 02/18/2023 X-ray pelvis 2 views AP and lateral Hip joints appear normal bilaterally.  SI joints are normal bilaterally.  May be mild degenerative change in visualized portion of the lumbar spine without acute appearing abnormality.  No erosions or abnormal calcifications seen. Impression Mild degenerative changes  CT CHEST WO CONTRAST  Result Date: 02/10/2023 CLINICAL DATA:  Follow-up lung nodules EXAM: CT CHEST WITHOUT CONTRAST TECHNIQUE: Multidetector CT imaging of the chest was performed following the standard protocol without IV contrast. RADIATION DOSE REDUCTION: This exam was performed according to the departmental dose-optimization program which includes automated exposure control, adjustment of the mA and/or kV according to patient size and/or use of iterative reconstruction technique. COMPARISON:  Chest CT 11/17/2022, 03/08/2008 FINDINGS: Cardiovascular: Limited evaluation without intravenous contrast. Moderate aortic atherosclerosis. Coronary vascular calcification. Normal cardiac size. No pericardial effusion Mediastinum/Nodes: Midline trachea. No suspicious thyroid mass. Enlarged mediastinal lymph nodes as before. Dominant precarinal lymph node with minimal calcification measuring 19 mm, not significantly changed. Bilateral hilar adenopathy better seen on the previous contrasted exam. Esophagus is normal. Lungs/Pleura: Apical pleural and parenchymal scarring. Redemonstrated numerous calcified and noncalcified lung nodules. Largest noncalcified lung nodule is seen in the right lower lobe on series 8, image 103 and measures 7 mm, previously 7 mm. The largest noncalcified nodule on the left is seen in the left lower lobe and measures 10 mm, previously 10 mm, series 8, image 82. There are no new pulmonary nodules. No acute airspace  disease. Upper Abdomen: No acute finding. Thickened left adrenal gland without dominant nodule Musculoskeletal: No acute or suspicious osseous abnormality. IMPRESSION: 1. Stable appearance of multiple calcified and noncalcified pulmonary nodules since 11/17/2022, increased from more remote exam from 2009 and suspected to be related to the history of sarcoidosis. Consider CT follow-up in 18-24 months (from examination performed in 02/11/2023) for continued monitoring. 2. Similar mediastinal adenopathy felt consistent with history of sarcoid Aortic Atherosclerosis (ICD10-I70.0). Electronically Signed   By: Jasmine Pang M.D.   On: 02/10/2023 23:29    Recent Labs: Lab Results  Component Value Date   WBC 6.5 12/18/2022   HGB 13.1 12/18/2022   PLT 242 12/18/2022   NA 140 12/18/2022   K 4.7 12/18/2022   CL 103 12/18/2022   CO2 23 12/18/2022   GLUCOSE 90 12/18/2022   BUN 16 12/18/2022   CREATININE 1.10 (H) 12/18/2022   BILITOT 0.3 12/18/2022   ALKPHOS 81 12/18/2022   AST 27 12/18/2022   ALT 20 12/18/2022   PROT 6.7 12/18/2022   ALBUMIN 4.4 12/18/2022   CALCIUM 9.7 12/18/2022    Speciality Comments:  No specialty comments available.  Procedures:  No procedures performed Allergies: Propranolol hcl er, Alendronate, Ibuprofen, and Lexapro [escitalopram oxalate]   Assessment / Plan:     Visit Diagnoses: Sarcoidosis Bilateral hand pain - Plan: XR Hand 2 View Right, XR Hand 2 View Left  Recent evaluation not demonstrating much serologic evidence of disease activity.  Exam is pretty unremarkable looks more like primary osteoarthritis may have some radicular or impingement symptoms as well.  X-rays checked do not demonstrate any particular opacities or evidence of erosions to suggest sarcoid arthropathy in need of disease specific treatment.  Amnestic MCI (mild cognitive impairment with memory loss)  Difficult to obtain a good history of present disease due to patient cognitive impairment.   Reports primary care of legal as doing a recent injection but tried to contact their office did not find any record for this.  Pain in left hip - Plan: XR Pelvis 1-2 Views  Pain in left hip some going to the back check x-ray to rule out sacroiliitis is unremarkable.  Probable mild degenerative arthritis in the spine contributory but probably also some myofascial pain or impingement.   Orders: Orders Placed This Encounter  Procedures   XR Pelvis 1-2 Views   XR Hand 2 View Right   XR Hand 2 View Left   No orders of the defined types were placed in this encounter.    Follow-Up Instructions: No follow-ups on file.   Fuller Plan, MD  Note - This record has been created using [employer].  Chart creation errors have been sought, but may not always  have been located. Such creation errors do not reflect on  the standard of medical care.

## 2023-02-04 ENCOUNTER — Ambulatory Visit
Admission: RE | Admit: 2023-02-04 | Discharge: 2023-02-04 | Disposition: A | Discharge status: Home / Self Care | Payer: Medicare HMO | Source: Ambulatory Visit | Attending: Pulmonary Disease | Admitting: Pulmonary Disease

## 2023-02-04 ENCOUNTER — Encounter (HOSPITAL_BASED_OUTPATIENT_CLINIC_OR_DEPARTMENT_OTHER): Payer: Self-pay | Admitting: Pulmonary Disease

## 2023-02-04 ENCOUNTER — Other Ambulatory Visit (HOSPITAL_BASED_OUTPATIENT_CLINIC_OR_DEPARTMENT_OTHER): Payer: Self-pay | Admitting: Pulmonary Disease

## 2023-02-04 DIAGNOSIS — R918 Other nonspecific abnormal finding of lung field: Secondary | ICD-10-CM

## 2023-02-04 DIAGNOSIS — Z9229 Personal history of other drug therapy: Secondary | ICD-10-CM

## 2023-02-24 ENCOUNTER — Other Ambulatory Visit: Payer: Self-pay | Admitting: Physician Assistant

## 2023-02-24 ENCOUNTER — Ambulatory Visit (HOSPITAL_BASED_OUTPATIENT_CLINIC_OR_DEPARTMENT_OTHER): Payer: Medicare HMO | Admitting: Pulmonary Disease

## 2023-03-03 ENCOUNTER — Encounter: Payer: Self-pay | Admitting: Physician Assistant

## 2023-03-04 ENCOUNTER — Ambulatory Visit (HOSPITAL_BASED_OUTPATIENT_CLINIC_OR_DEPARTMENT_OTHER): Payer: Medicare HMO | Admitting: Pulmonary Disease

## 2023-03-04 ENCOUNTER — Encounter (HOSPITAL_BASED_OUTPATIENT_CLINIC_OR_DEPARTMENT_OTHER): Payer: Self-pay | Admitting: Pulmonary Disease

## 2023-03-04 VITALS — BP 146/82 | HR 76 | Temp 98.8°F | Ht 66.0 in | Wt 117.0 lb

## 2023-03-04 DIAGNOSIS — D869 Sarcoidosis, unspecified: Secondary | ICD-10-CM | POA: Diagnosis not present

## 2023-03-04 NOTE — Progress Notes (Signed)
Subjective:   PATIENT ID: Aggie Hacker GENDER: female DOB: 1956-05-22, MRN: [id number]  Chief Complaint  Patient presents with   Follow-up    Follow up. Patient has no complaints.     Reason for Visit: Follow-up sarcoid  Ms. Ladavia Lindenbaum is a 67 year old female with sarcoidosis, allergic rhinitis, hypothyroidism, amnestic mild cognitive impairment, HTN, HLD, osteoporosis, vitamin D deficiency and GAD who presents for follow-up for sarcoid.  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 Denies limitations in activity. Denies fever, fatigue, weight loss, visual disturbance, dyspnea, cough, shortness of breath, wheezing, palpitations, abdominal pain, numbness, tingling, lightheadedness, syncope. Denies family history of sarcoid. Denies personal history of asthma. Wears glasses but does not go to eye doctor annually.  12/18/22 Presents with different daughter this clinic visit. Patient does not recall discussing sarcoid on last visit. She is a poor historian at baseline. Does not recall receiving handout on sarcoid with known MCI. Daughter wishes to discuss CT scan. Patient with no symptoms. Denies cough, shortness of breath or wheezing. No other symptoms that she is concerned with. No limitation in activity  03/05/23 Presents with daughter to provide additional history. Patient denies any respiratory symptoms. Denies shortness of breath, cough or wheezing. No recent weight loss. No night sweats. Continues to have memory issues and defers to family.  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, research (physical sciences) x 30 years No pets  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    Generalized anxiety disorder    Hyperlipidemia    Hypertensive disorder 01/25/2018   Pt is not taking Amlodipine which was prescribed to her pt states she was informed by previous physician as long as she keep bp below 140 she was ok   Hypothyroidism    Migraine    Muscle spasms of neck 09/19/2018   Musculoskeletal pain 03/15/2008   Osteopenia    Osteoporosis 01/25/2018   Bone Density/[place]/ 2017 or 2018   Plantar fasciitis of right foot 06/01/2019   Sarcoidosis    Seasonal and perennial allergic rhinitis 03/14/2008   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   Vitamin D deficiency 01/25/2018     Family History  Problem Relation Age of Onset   Hypertension Mother    Kidney failure Mother    Heart disease Father    Heart disease Brother    Prostate cancer Brother    Heart disease Sister      Social History   Occupational History   Occupation: maint. tech  Tobacco Use   Smoking status: Never    Passive exposure: Past   Smokeless tobacco: Never  Vaping Use   Vaping status: Never Used  Substance and Sexual Activity   Alcohol use: No   Drug use: No   Sexual activity: Not on file    Allergies  Allergen Reactions   Propranolol Hcl Er Shortness Of Breath   Alendronate Other (See Comments)   Ibuprofen Other (See Comments)    Stomach pains  Other Reaction(s): 800 mg - stomach pains  Stomach pains  Stomach pains   Lexapro [Escitalopram Oxalate]     Hoarse     Outpatient Medications Prior to Visit  Medication Sig Dispense Refill   Acetaminophen 500 MG  capsule 1 capsule as needed Orally every 4 hrs     amLODipine (NORVASC) 5 MG tablet amlodipine 5 mg tablet  1 TABLET ORALLY ONCE A DAY, DR REDDY BRAND 90 DAYS     cholecalciferol (VITAMIN D) 1000 UNITS tablet Take 1,000 Units by mouth daily.     Cyanocobalamin (VITAMIN B 12 PO) Take by mouth as needed.     donepezil (ARICEPT) 10 MG tablet TAKE 1/2 TABLET DAILY X 2 WEEKS THEN INCREASE TO FULL TABLET 90 tablet 3    ibandronate (BONIVA) 150 MG tablet Take 150 mg by mouth every 30 (thirty) days.     SYNTHROID 75 MCG tablet Take 1 tablet by mouth daily.     albuterol (VENTOLIN HFA) 108 (90 Base) MCG/ACT inhaler Inhale 1-2 puffs into the lungs every 6 (six) hours as needed for wheezing or shortness of breath. (Patient not taking: Reported on 10/15/2022) 1 each 0   ALPRAZolam (XANAX) 0.25 MG tablet Take 0.25 mg by mouth at bedtime as needed for anxiety. (Patient not taking: Reported on 10/15/2022)     cyclobenzaprine (FLEXERIL) 10 MG tablet Take 10 mg by mouth as needed for muscle spasms. (Patient not taking: Reported on 10/15/2022)     fluticasone (FLONASE) 50 MCG/ACT nasal spray Place 2 sprays into both nostrils daily. (Patient not taking: Reported on 10/15/2022)     No facility-administered medications prior to visit.    Review of Systems  Constitutional:  Negative for chills, diaphoresis, fever, malaise/fatigue and weight loss.  HENT:  Negative for congestion.   Respiratory:  Negative for cough, hemoptysis, sputum production, shortness of breath and wheezing.   Cardiovascular:  Negative for chest pain, palpitations and leg swelling.     Objective:   Vitals:   03/04/23 1049  BP: (!) 146/82  Pulse: 76  Temp: 98.8 F (37.1 C)  TempSrc: Oral  SpO2: 100%  Weight: 117 lb (53.1 kg)  Height: 5\' 6"  (1.676 m)   SpO2: 100 % O2 Device: None (Room air)  Physical Exam: General: Well-appearing, no acute distress HENT: [place], AT Eyes: EOMI, no scleral icterus Respiratory: Clear to auscultation bilaterally.  No crackles, wheezing or rales Cardiovascular: RRR, -M/R/G, no JVD Extremities:-Edema,-tenderness Neuro: AAO x4, CNII-XII grossly intact Psych: Normal mood, normal affect  Data Reviewed:  Imaging: CT Chest 03/08/08 - Mediastinal and hilar adenopathy with calcification in the right hilum. Scattered granulomas MR Brain 02/15/22 Mild global parenchymal volume loss without discernible lobar predominance or  disproportionate hippocampal atrophy and mild chronic white matter microangiopathy CXR 10/15/22 - Multiple pulmonary nodules R>L, new compared to 01/14/16. CT Chest 11/17/22 - Multiple calcified and noncalcified nodules bilaterally with largest in LLL measuring 1 cm and RLL 1.2 cm CT Chest 02/04/23 - Stable bilateral calcified and noncalcified nodules compared to March 2024 scan  PFT: 12/18/16  Spirometry - normal  Labs:    Latest Ref Rng & Units 12/18/2022   10:33 AM 02/02/2022    9:11 AM  CBC  WBC 3.4 - 10.8 x10E3/uL 6.5  5.6   Hemoglobin 11.1 - 15.9 g/dL 16.1  09.6   Hematocrit 34.0 - 46.6 % 40.1  42.9   Platelets 150 - 450 x10E3/uL 242  228.0       Latest Ref Rng & Units 12/18/2022   10:33 AM  BMP  Glucose 70 - 99 mg/dL 90   BUN 8 - 27 mg/dL 16   Creatinine 0.45 - 1.00 mg/dL 4.09   BUN/Creat Ratio 12 - 28 15   Sodium  134 - 144 mmol/L 140   Potassium 3.5 - 5.2 mmol/L 4.7   Chloride 96 - 106 mmol/L 103   CO2 20 - 29 mmol/L 23   Calcium 8.7 - 10.3 mg/dL 9.7       Latest Ref Rng & Units 12/18/2022   10:33 AM  Hepatic Function  Total Protein 6.0 - 8.5 g/dL 6.7   Albumin 3.9 - 4.9 g/dL 4.4   AST 0 - 40 IU/L 27   ALT 0 - 32 IU/L 20   Alk Phosphatase 44 - 121 IU/L 81   Total Bilirubin 0.0 - 1.2 mg/dL 0.3    Labs reviewed with known CKD IIIa.    Assessment & Plan:   Discussion: 67 year old female with sarcoidosis, allergic rhinitis, hypothyroidism, amnestic mild cognitive impairment, HTN, HLD, osteoporosis, CKD IIIa, vitamin D deficiency and GAD who presents for follow-up for sarcoid. Asymptomatic. Reviewed CT imaging with patient and daughter since 2009 which demonstrates increased nodules since remote exam but stable sarcoid compared to March 2024, three months ago. Recommend surveillance at this time.  We discussed the clinical course of sarcoid and management including serial PFTs, labs, eye exam, and EKG and chest imaging if indicated. If symptoms suggest sarcoid flare in  the future, we would manage with steroids +/- biologics. At this time we do not have evidence of active disease so would not warrant treatment. We would also need a tissue diagnosis before starting any immunosuppressants. Will obtain labs for baseline.   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 Stable sarcoid in the last 3 months --ORDER CT chest without contrast in 1 year  History of immunosuppression High risk medication management --Annual labs   Sarcoid Monitoring --Recent chest imaging reviewed.  --Annual PFTs.  Last spirometry 2018. Ordered for next visit --Annual ophthalmology exam.  --Will need baseline EKG at next visit --Routine labs as needed: CBC with diff, CMET, 1, 25 and 25 hydroxy vitamin D  CKD IIIa Managed by PCP  Health Maintenance Immunization History  Administered Date(s) Administered   Fluad Quad(high Dose 65+) 05/11/2022   H1N1 09/13/2008   Influenza Split 06/07/2008, 05/06/2009, 05/18/2011, 05/24/2016, 07/24/2017   Influenza, High Dose Seasonal PF 05/10/2019, 06/23/2021, 05/25/2022   Influenza,inj,Quad PF,6+ Mos 09/08/2018, 04/24/2019   Influenza,inj,Quad PF,6-35 Mos 05/06/2020   Moderna Sars-Covid-2 Vaccination 11/03/2019, 12/04/2019, 07/31/2020, 12/19/2020   PFIZER(Purple Top)SARS-COV-2 Vaccination 06/17/2021   PPD Test 09/11/2021   Pneumococcal Polysaccharide-23 10/25/2018   Tdap 05/06/2009, 10/25/2018   Zoster Recombinant(Shingrix) 09/19/2018   Zoster, Live 09/07/2018, 05/19/2021, 11/08/2022   CT Lung Screen - not qualified. Insufficient tobacco history  Orders Placed This Encounter  Procedures   CT Chest Wo Contrast    Schedule in 1 year (01/2024)    Standing Status:   Future    Standing Expiration Date:   03/03/2024    Order Specific Question:   Preferred imaging location?    Answer:   [id number]   Pulmonary function test    Standing Status:   Future     Standing Expiration Date:   03/03/2024    Order Specific Question:   Where should this test be performed?    Answer:   [facility]    Order Specific Question:   Full PFT: includes the following: basic spirometry, spirometry pre & post bronchodilator, diffusion capacity (DLCO), lung volumes    Answer:   Full PFT  No orders of the defined types were  placed in this encounter.   Return in about 1 year (around 03/03/2024) for after PFT (in 1 year), after CT scan (in 1 year).  I have spent a total time of 26-minutes on the day of the appointment including chart review, data review, collecting history, coordinating care and discussing medical diagnosis and plan with the patient/family. Past medical history, allergies, medications were reviewed. Pertinent imaging, labs and tests included in this note have been reviewed and interpreted independently by me.  Melanee Cordial Mechele Collin, MD [facility] 03/04/2023 10:57 AM  Office Number [phone]

## 2023-03-04 NOTE — Patient Instructions (Addendum)
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

## 2023-03-05 ENCOUNTER — Encounter (HOSPITAL_BASED_OUTPATIENT_CLINIC_OR_DEPARTMENT_OTHER): Payer: Self-pay | Admitting: Pulmonary Disease

## 2023-03-05 ENCOUNTER — Encounter: Payer: Self-pay | Admitting: Physician Assistant

## 2023-03-05 ENCOUNTER — Ambulatory Visit: Payer: Medicare HMO | Admitting: Physician Assistant

## 2023-03-05 VITALS — BP 119/72 | HR 90 | Resp 20 | Ht 66.0 in | Wt 118.0 lb

## 2023-03-05 DIAGNOSIS — G3184 Mild cognitive impairment, so stated: Secondary | ICD-10-CM | POA: Diagnosis not present

## 2023-03-05 NOTE — Patient Instructions (Signed)
It was a pleasure to see you today at our office.   Recommendations:  Follow up in 6  months Repeat the neurocognitive testing   Continue Donepezil as prescribed.  Continue B12     Whom to call:  Memory  decline, memory medications: Call our office [phone]   For psychiatric meds, mood meds: Please have your primary care physician manage these medications.      For assessment of decision of mental capacity and competency:  Call Dr. Erick Blinks, geriatric psychiatrist at [phone]  For guidance in geriatric dementia issues please call [facility] [phone]    If you have any severe symptoms of a stroke, or other severe issues such as confusion,severe chills or fever, etc call 911 or go to the ER as you may need to be evaluated further       RECOMMENDATIONS FOR ALL PATIENTS WITH MEMORY PROBLEMS: 1. Continue to exercise (Recommend 30 minutes of walking everyday, or 3 hours every week) 2. Increase social interactions - continue going to [place] and enjoy social gatherings with friends and family 3. Eat healthy, avoid fried foods and eat more fruits and vegetables 4. Maintain adequate blood pressure, blood sugar, and blood cholesterol level. Reducing the risk of stroke and cardiovascular disease also helps promoting better memory. 5. Avoid stressful situations. Live a simple life and avoid aggravations. Organize your time and prepare for the next day in anticipation. 6. Sleep well, avoid any interruptions of sleep and avoid any distractions in the bedroom that may interfere with adequate sleep quality 7. Avoid sugar, avoid sweets as there is a strong link between excessive sugar intake, diabetes, and cognitive impairment We discussed the Mediterranean diet, which has been shown to help patients reduce the risk of progressive memory disorders and reduces cardiovascular risk. This includes eating fish, eat fruits and green leafy vegetables, nuts like almonds and  hazelnuts, walnuts, and also use olive oil. Avoid fast foods and fried foods as much as possible. Avoid sweets and sugar as sugar use has been linked to worsening of memory function.  There is always a concern of gradual progression of memory problems. If this is the case, then we may need to adjust level of care according to patient needs. Support, both to the patient and caregiver, should then be put into place.    FALL PRECAUTIONS: Be cautious when walking. Scan the area for obstacles that may increase the risk of trips and falls. When getting up in the mornings, sit up at the edge of the bed for a few minutes before getting out of bed. Consider elevating the bed at the head end to avoid drop of blood pressure when getting up. Walk always in a well-lit room (use night lights in the walls). Avoid area rugs or power cords from appliances in the middle of the walkways. Use a walker or a cane if necessary and consider physical therapy for balance exercise. Get your eyesight checked regularly.  FINANCIAL OVERSIGHT: Supervision, especially oversight when making financial decisions or transactions is also recommended.  HOME SAFETY: Consider the safety of the kitchen when operating appliances like stoves, microwave oven, and blender. Consider having supervision and share cooking responsibilities until no longer able to participate in those. Accidents with firearms and other hazards in the house should be identified and addressed as well.   ABILITY TO BE LEFT ALONE: If patient is unable to contact 911 operator, consider using [facility], or when the need is there, arrange for someone to  stay with patients. Smoking is a fire hazard, consider supervision or cessation. Risk of wandering should be assessed by caregiver and if detected at any point, supervision and safe proof recommendations should be instituted.  MEDICATION SUPERVISION: Inability to self-administer medication needs to be constantly addressed.  Implement a mechanism to ensure safe administration of the medications.   DRIVING: Regarding driving, in patients with progressive memory problems, driving will be impaired. We advise to have someone else do the driving if trouble finding directions or if minor accidents are reported. Independent driving assessment is available to determine safety of driving.   If you are interested in the driving assessment, you can contact the following:  [facility] in [place] [phone]  [facility] [phone]  [facility] [phone]  [facility] [phone] or [phone]

## 2023-03-05 NOTE — Progress Notes (Signed)
Assessment/Plan:   Amnestic mild cognitive impairment (MCI)  Mary Taylor is a very pleasant 67 y.o. RH female with a history of hypertension, hyperlipidemia, nonalcoholic liver disease, hypothyroidism, sarcoidosis, multiple pulmonary nodules likely due to sarcoidosis, social anxiety, history of migraine headaches, vitamin B12 deficiency, and a diagnosis of Mild Cognitive Impairment as per Neuropsych evaluation seen today in follow up for memory loss. Patient is currently on donepezil 10 mg daily, tolerating well.  It is unclear if she has missed any doses.  Memory is stable, MMSE today is 23/30.  She continues to perform well on her ADLs and continues to drive and work.   Follow up in  6 months. Continue B12 supplements Repeat neuropsych evaluation for clarity of diagnosis and disease trajectory  Continue donepezil 10 mg daily, side effects discussed. Recommend good control of her cardiovascular risk factors Continue to control mood as per PCP    Subjective:    This patient is accompanied in the office by her daughter  who supplements the history.  Previous records as well as any outside records available were reviewed prior to todays visit. Patient was last seen on 09/07/22 with MMSE 23/30.    Any changes in memory since last visit? "Sometimes I have to think harder especially when waking up". At that time she has a hard time putting the words together.  Able to remember recent conversations and people's names.  She continues to work at denies any difficulties of the job. repeats oneself?  Endorsed but not frequently  Disoriented when walking into a room?  Patient denies    Leaving objects in unusual places?  May misplace things but not in unusual places   Wandering behavior?  denies   Any personality changes since last visit?  denies   Any depression?:  She has a history of depression and anxiety. Hallucinations or paranoia?  denies   Seizures? denies    Any sleep changes?   Sleeps well . Denies vivid dreams, REM behavior or sleepwalking   Sleep apnea?   denies   Any hygiene concerns? denies  Independent of bathing and dressing?  Endorsed  Does the patient needs help with medications?  Patient is in charge occasionally misses doses   Who is in charge of the finances?  Patient is in charge     Any changes in appetite?  Denies, trying to gain weight.      Patient have trouble swallowing? denies   Does the patient cook? No Any headaches?   denies   Ambulates with difficulty?  He has a history of joint pain in multiple areas in the setting of sarcoidosis which may limit her mobility followed by rheumatology. Recent falls or head injuries? denies     Unilateral weakness, numbness or tingling?  She has a history of chronic peripheral neuropathy, and takes  B12 and it helps.  Any tremors?  denies   Any anosmia?  Patient denies   Any incontinence of urine?  Denies Any bowel dysfunction?     denies      Patient lives with her daughter   Does the patient drive?  Yes, denies getting lost.  Continues to work as maintenance at [employer]   Initial evaluation 02/02/2022 How long did patient have memory difficulties?  Patient daughter reports that she noticed her mother having some memory difficulties for about 6 months, when she began to repeat herself more frequently and having a hard time remembering conversations.  She even bought  over-the-counter memory supplement that did not help.  However, the patient reported to incidents that got her concerned. One was 1 month ago, when she woke up, and she was disoriented, she did not know the time, date, but as soon as she saw the paperwork on top of the table (of her new job), she knew where she was.  This happened once again.  Of note, she states that when "I have something to do,I am okay. It is  when I do not have anything to do that I become like this ".  She denies any recent tics, infections, COVID, she  denies any prior history of seizures.  Her daughter states "that the only thing that helped her it was a little lorazepam, then she was okay ".  When she has the "blank moments ", she denies loss of consciousness, or any other seizure symptoms. Patient lives with daughter, who noticed that she began repeating herself about 3 months ago, she says that the anxiety medicine helps, and this is worse in the morning.   Disoriented when walking into a room?  Endorsed, as above Leaving objects in unusual places?  Patient denies   Ambulates  with difficulty?   Patient denies   Recent falls?  Patient denies   Any head injuries?  Patient denies   History of seizures?   Patient denies, and she also denies any body aching after these "blank", any involuntary secretions, involuntary urination, falls, metallic or blood taste.   Wandering behavior?  Patient denies   Patient drives?   She continues to drive, denies any difficulty doing so.  She denies getting lost.   Any mood changes such irritability agitation?  Since moving, to be with her family here in [place], the patient may be experiencing some situational depression.  She never was diagnosed with depression or treated for it.  However, she admits to feeling "a little lost in the new environment.  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 Hallucinations?  Patient denies   Paranoia?  Patient denies   Patient reports that he sleeps well without vivid dreams, REM behavior or sleepwalking   History of sleep apnea?  Patient denies   Any hygiene concerns?  Patient denies   Independent of bathing and dressing?  Endorsed  Does the patient needs help with medications?  Patient denies.   Who is in charge of the finances?  Patient is in charge Any changes in appetite?  Patient denies   Patient have trouble swallowing? Patient denies   Does the patient cook?  Patient denies   Any kitchen accidents such  as leaving the stove on? Patient denies   No vision changes Any focal numbness or tingling?  She has a history of bilateral foot neuropathy, followed by PCP. Chronic back pain Patient denies   Unilateral weakness?  Patient denies   Any tremors?  Patient denies   Any history of anosmia?  Patient denies   Any incontinence of urine?  Patient denies   Any bowel dysfunction?   Patient denies   History of heavy alcohol intake?  Patient denies   History of heavy tobacco use?  Patient denies   Family history of dementia?  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 Has just retired from normal life in August 2022, moved in September 2022 from [place] due to stressful situation.   Recent Labs B12  358,  Vit D 68.2,    Neuropsychological consultation 06/05/2022  "Briefly, results suggested prominent impairment surrounding all aspects of learning and memory. Additional impairments were exhibited across executive functioning and receptive language, while performance variability was exhibited across processing speed, attention/concentration, semantic fluency, confrontation naming, and visuospatial abilities. The etiology for ongoing impairment is unclear. However, there is reason to have greater concern surrounding an early-onset Alzheimer's disease presentation given memory performances. Across memory tasks, Ms. Happ did not benefit from repeated exposure to information, was essentially amnestic across all memory tasks after a brief delay, and performed very poorly across yes/no recognition trials. 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 Deficits surrounding confrontation naming and visuospatial abilities, along with a discrepancy in verbal fluency scores (semantic generally worse than phonemic), are all further consistent with expected disease progression. Despite the rarity of this condition  at Ms. Jasmin's age, continued medical monitoring will be important moving forward.   PREVIOUS MEDICATIONS:   CURRENT MEDICATIONS:  Outpatient Encounter Medications as of 03/05/2023  Medication Sig   Acetaminophen 500 MG capsule 1 capsule as needed Orally every 4 hrs   ALPRAZolam (XANAX) 0.5 MG tablet 1 tablet Orally Twice a day, as needed for anxiety for 30 days   amLODipine (NORVASC) 5 MG tablet amlodipine 5 mg tablet  1 TABLET ORALLY ONCE A DAY, DR REDDY BRAND 90 DAYS   cholecalciferol (VITAMIN D) 1000 UNITS tablet Take 1,000 Units by mouth daily.   Cyanocobalamin (VITAMIN B 12 PO) Take by mouth as needed.   donepezil (ARICEPT) 10 MG tablet TAKE 1/2 TABLET DAILY X 2 WEEKS THEN INCREASE TO FULL TABLET   fluticasone (FLONASE) 50 MCG/ACT nasal spray Place 2 sprays into both nostrils daily.   ibandronate (BONIVA) 150 MG tablet Take 150 mg by mouth every 30 (thirty) days.   SYNTHROID 75 MCG tablet Take 1 tablet by mouth daily.   albuterol (VENTOLIN HFA) 108 (90 Base) MCG/ACT inhaler Inhale 1-2 puffs into the lungs every 6 (six) hours as needed for wheezing or shortness of breath. (Patient not taking: Reported on 03/05/2023)   [DISCONTINUED] ALPRAZolam (XANAX) 0.25 MG tablet Take 0.25 mg by mouth at bedtime as needed for anxiety. (Patient not taking: Reported on 10/15/2022)   [DISCONTINUED] cyclobenzaprine (FLEXERIL) 10 MG tablet Take 10 mg by mouth as needed for muscle spasms. (Patient not taking: Reported on 10/15/2022)   No facility-administered encounter medications on file as of 03/05/2023.       03/05/2023   12:00 PM 09/07/2022   10:00 AM  MMSE - Mini Mental State Exam  Orientation to time 4 3  Orientation to Place 4 5  Registration 3 3  Attention/ Calculation 4 3  Recall 0 0  Language- name 2 objects 2 2  Language- repeat 1 1  Language- follow 3 step command 3 3  Language- read & follow direction 1 1  Write a sentence 0 1  Copy design 1 1  Total score 23 23      03/06/2022    12:00 PM 02/02/2022    9:00 AM  Montreal Cognitive Assessment   Visuospatial/ Executive (0/5) 3 1  Naming (0/3) 2 2  Attention: Read list of digits (0/2) 1 2  Attention: Read list of letters (0/1) 1 1  Attention: Serial 7 subtraction starting at 100 (0/3) 0 0  Language: Repeat phrase (0/2) 1 1  Language : Fluency (0/1) 1 0  Abstraction (0/2) 1 0  Delayed Recall (0/5) 1 0  Orientation (0/6) 5 2  Total 16 9  Adjusted Score (based on education) 16 9    Objective:     PHYSICAL EXAMINATION:    VITALS:   Vitals:   03/05/23 1039  BP: 119/72  Pulse: 90  Resp: 20  SpO2: 97%  Weight: 118 lb (53.5 kg)  Height: 5\' 6"  (1.676 m)    GEN:  The patient appears stated age and is in NAD. HEENT:  Normocephalic, atraumatic.   Neurological examination:  General: NAD, well-groomed, appears stated age. Orientation: The patient is alert. Oriented to person, place and not to year Cranial nerves: There is good facial symmetry.The speech is fluent and clear, tangential . No aphasia or dysarthria. Fund of knowledge is appropriate. Recent memory impaired, remote memory normal.  Attention and concentration are reduced.  Able to name objects and repeat phrases.  Hearing is intact to conversational tone.   Sensation: Sensation is intact to light touch throughout Motor: Strength is at least antigravity x4. DTR's 2/4 in UE/LE     Movement examination: Tone: There is normal tone in the UE/LE Abnormal movements:  no tremor.  No myoclonus.  No asterixis.   Coordination:  There is no decremation with RAM's. Normal finger to nose  Gait and Station: The patient has no difficulty arising out of a deep-seated chair without the use of the hands. The patient's stride length is good.  Gait is cautious and narrow.    Thank you for allowing [place] the opportunity to participate in the care of this nice patient. Please do not hesitate to contact us for any questions or concerns.   Total time spent on today's  visit was 37 minutes dedicated to this patient today, preparing to see patient, examining the patient, ordering tests and/or medications and counseling the patient, documenting clinical information in the EHR or other health record, independently interpreting results and communicating results to the patient/family, discussing treatment and goals, answering patient's questions and coordinating care.  Cc:  Lorenda Ishihara, MD  Marlowe Kays 03/05/2023 12:21 PM

## 2023-03-08 ENCOUNTER — Ambulatory Visit: Payer: Medicare HMO | Admitting: Physician Assistant

## 2023-05-12 ENCOUNTER — Ambulatory Visit: Payer: Medicare HMO

## 2023-05-12 ENCOUNTER — Ambulatory Visit: Payer: Medicare HMO | Admitting: Psychology

## 2023-05-12 ENCOUNTER — Encounter: Payer: Self-pay | Admitting: Psychology

## 2023-05-12 DIAGNOSIS — F0394 Unspecified dementia, unspecified severity, with anxiety: Secondary | ICD-10-CM | POA: Insufficient documentation

## 2023-05-12 DIAGNOSIS — R4189 Other symptoms and signs involving cognitive functions and awareness: Secondary | ICD-10-CM

## 2023-05-12 DIAGNOSIS — F03A4 Unspecified dementia, mild, with anxiety: Secondary | ICD-10-CM | POA: Diagnosis not present

## 2023-05-12 DIAGNOSIS — F03A0 Unspecified dementia, mild, without behavioral disturbance, psychotic disturbance, mood disturbance, and anxiety: Secondary | ICD-10-CM

## 2023-05-12 HISTORY — DX: Unspecified dementia, mild, without behavioral disturbance, psychotic disturbance, mood disturbance, and anxiety: F03.A0

## 2023-05-12 NOTE — Progress Notes (Signed)
NEUROPSYCHOLOGICAL EVALUATION Mary Taylor. [facility] Department of Neurology  Date of Evaluation: May 12, 2023  Reason for Referral:   Mary Taylor is a 67 y.o. right-handed African-American female referred by Marlowe Kays, PA-C, to characterize her current cognitive functioning and assist with diagnostic clarity and treatment planning in the context of a prior amnestic mild cognitive impairment diagnosis and concerns for progressive cognitive decline.   Assessment and Plan:   Clinical Impression(s): Mary Taylor's pattern of performance is suggestive of fairly diffuse cognitive impairment. Performances were generally appropriate relative to age-matched peers across processing speed, basic attention, and phonemic fluency, while some performance variability was exhibited across complex attention and visuospatial abilities. However, severe impairment was exhibited across all other domains. This includes executive functioning, safety/judgment, receptive language, semantic fluency, confrontation naming, and essentially all aspects of learning and memory. Functionally, Mary Taylor continues to work. She and her daughter noted that this is in a highly structured and routine-based position. They also generally denied instrumental ADL dysfunction. This is admittedly surprising given the overall severity of cognitive impairment. In my opinion, said impairment warrants diagnostic consideration surrounding a Major Neurocognitive Disorder ("dementia"). However, she would be towards the very mild end of this spectrum if day-to-day functioning is truly without issue.   Relative to her previous evaluation in October 2023, mild decline was exhibited across executive functioning, receptive language, semantic fluency, and confrontation naming. More subtle decline could be argued across complex attention. Severe verbal memory impairment with 0% retention after brief delays was  exhibited across both evaluations. Other assessed domains largely exhibited stability. However, in some cases, this represents stable significant cognitive impairment.   Regarding the underlying cause of her mild dementia presentation, I continue to have primary concerns surrounding an early-onset Alzheimer's disease presentation. Across memory tasks, Mary Taylor did not benefit from repeated exposure to novel information. After a brief delay, she was fully amnestic (i.e., 0% retention) across both verbal memory tasks. She only exhibited 33% retention across the remaining visual memory task. Outside of an adequate performance across a visual memory recognition task, performance across two verbal measures was very poor. Overall, memory performances suggest evidence for rapid forgetting and an evolving and already quite significant storage impairment, both of which are the hallmark testing patterns for this illness. Further impairment with evidence of negative progression over the past 11 months surrounding semantic fluency, confrontation naming, and executive functioning follows typical and expected disease progression, further worsening current concerns. Despite the relative rarity of this condition at Mary Taylor's age, I continue to view this illness as a plausible and reasonable explanation for ongoing deficits.   Recommendations: Mary Taylor has already been prescribed a medication aimed to address memory loss and concerns surrounding Alzheimer's disease (i.e., donepezil/Aricept). She is encouraged to continue taking this medication as prescribed. It is important to highlight that this medication has been shown to slow functional decline in some individuals. There is no current treatment which can stop or reverse cognitive decline when caused by a neurodegenerative illness.   If there is a desire for greater diagnostic clarity surrounding Alzheimer's disease, a lumbar puncture would likely be  beneficial. Mary Taylor and her family could also discuss other blood tests or forms of neuroimaging (e.g., FDG-PET scan) with Mary Taylor.  Performance across neurocognitive testing is not a strong predictor of an individual's safety operating a motor vehicle. Should her family wish to pursue a formalized driving evaluation, they could reach out to the following agencies:  [employer] in [place]: [phone] [facility]: [phone] [facility]: [phone] [facility]: [phone] or [phone]  Should there be progression of current deficits over time, Mary Taylor is unlikely to regain any independent living skills lost. Therefore, it is recommended that she remain as involved as possible in all aspects of household chores, finances, and medication management, with supervision to ensure adequate performance. She will likely benefit from the establishment and maintenance of a routine in order to maximize her functional abilities over time.  It will be important for Mary Taylor to have another person with her when in situations where she may need to process information, weigh the pros and cons of different options, and make decisions, in order to ensure that she fully understands and recalls all information to be considered.  If not already done, Mary Taylor and her family may want to discuss her wishes regarding durable power of attorney and medical decision making, so that she can have input into these choices. If they require legal assistance with this, long-term care resource access, or other aspects of estate planning, they could reach out to [employer] at [phone] for a free consultation. Additionally, they may wish to discuss future plans for caretaking and seek out community options for in home/residential care should they become necessary.  Mary Taylor is encouraged to attend to lifestyle factors for brain health  (e.g., regular physical exercise, good nutrition habits and consideration of the MIND-DASH diet, regular participation in cognitively-stimulating activities, and general stress management techniques), which are likely to have benefits for both emotional adjustment and cognition. Optimal control of vascular risk factors (including safe cardiovascular exercise and adherence to dietary recommendations) is encouraged. Continued participation in activities which provide mental stimulation and social interaction is also recommended.   Important information should be provided to Ms. Mcgrady in written format in all instances. This information should be placed in a highly frequented and easily visible location within her home to promote recall. External strategies such as written notes in a consistently used memory journal, visual and nonverbal auditory cues such as a calendar on the refrigerator or appointments with alarm, such as on a cell phone, can also help maximize recall.  Memory can be improved using internal strategies such as rehearsal, repetition, chunking, mnemonics, association, and imagery. External strategies such as written notes in a consistently used memory journal, visual and nonverbal auditory cues such as a calendar on the refrigerator or appointments with alarm, such as on a cell phone, can also help maximize recall.    To address problems with processing speed, she may wish to consider:   -Ensuring that she is alerted when essential material or instructions are being presented   -Adjusting the speed at which new information is presented   -Allowing for more time in comprehending, processing, and responding in conversation   -Repeating and paraphrasing instructions or conversations aloud  To address problems with fluctuating attention and/or executive dysfunction, she may wish to consider:   -Avoiding external distractions when needing to concentrate   -Limiting exposure to fast paced  environments with multiple sensory demands   -Writing down complicated information and using checklists   -Attempting and completing one task at a time (i.e., no multi-tasking)   -Verbalizing aloud each step of a task to maintain focus   -Taking frequent breaks during the completion of steps/tasks to avoid fatigue   -Reducing the amount of information considered at one time   -Scheduling more difficult activities  for a time of day where she is usually most alert  Review of Records:   Ms. Vickery was seen by [facility] Marlowe Kays, PA-C) on 02/02/2022 for an evaluation of memory loss. Memory concerns were said to be present for the prior six months. Examples included Ms. Schmier repeating herself and having a harder time remembering past conversations. She also described a few isolated instances where she would wake disoriented and not know the date or time. Her daughter reported memory concerns for the prior three months, similar to what is described above. She continues to work and ADLs were described as intact. Performance on a brief cognitive screening instrument (MOCA) was 9/30; however, there was suspicion for ongoing anxiety to have artificially impacted testing. Performance on a follow-up MOCA on 03/05/2022 was 16/30. Ultimately, Ms. Urbin was referred for a comprehensive neuropsychological evaluation to characterize her cognitive abilities and to assist with diagnostic clarity and treatment planning.  She completed a comprehensive neuropsychological evaluation with myself on 05/26/2022. Results suggested prominent impairment surrounding all aspects of learning and memory. Additional impairments were exhibited across executive functioning and receptive language, while performance variability was exhibited across processing speed, attention/concentration, semantic fluency, confrontation naming, and visuospatial abilities. 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 Concerns were expressed for an underlying neurodegenerative illness given memory performances. Repeat testing in 12-18 months was recommended.   Ms. Reise most recently met with Mary Taylor for follow-up on 03/05/2023. Cognition was largely described as stable. Ms. Perkins did note "Sometimes I have to think harder especially when waking up", as well as some word finding difficulties. She continues to work and ADL dysfunction was denied. Performance on a brief cognitive screening instrument (MMSE) was 23/30. Ultimately, Ms. Bernau was referred for a repeat neuropsychological evaluation to characterize her cognitive abilities and to assist with diagnostic clarity and treatment planning.   Neuroimaging   EEG on 02/12/2022 was normal. Brain MRI on 02/16/2022 revealed mild parenchymal volume loss without lobar predominance, as well as mild microvascular ischemic disease.   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   Chronic kidney disease, stage 3a 02/03/2023   Fatty liver disease, nonalcoholic 02/03/2023   Generalized anxiety disorder    Hyperlipidemia    Hypertension 01/25/2018   Pt is not taking Amlodipine which was prescribed to her pt states she was informed by previous physician as long as she keep bp below 140 she was ok     Hypothyroidism    Left leg paresthesias 02/03/2023   Migraine    Muscle spasms of neck 09/19/2018   Musculoskeletal pain 03/15/2008   Osteopenia    Osteoporosis 01/25/2018   Bone Density/[place]/ 2017 or 2018   Plantar fasciitis of right foot 06/01/2019   Primary osteoarthritis, unspecified hand 02/03/2023   Sarcoidosis    Seasonal and perennial allergic rhinitis 03/14/2008   Social  anxiety disorder 02/03/2023   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   Vitamin D deficiency 01/25/2018    Past Surgical History:  Procedure Laterality Date   BREAST CYST EXCISION  1970   THYROID SURGERY     goiter   TUBAL LIGATION  1987    Current Outpatient Medications:    Acetaminophen 500 MG capsule, 1 capsule as needed Orally every 4 hrs, Disp: ,  Rfl:    albuterol (VENTOLIN HFA) 108 (90 Base) MCG/ACT inhaler, Inhale 1-2 puffs into the lungs every 6 (six) hours as needed for wheezing or shortness of breath. (Patient not taking: Reported on 03/05/2023), Disp: 1 each, Rfl: 0   ALPRAZolam (XANAX) 0.5 MG tablet, 1 tablet Orally Twice a day, as needed for anxiety for 30 days, Disp: , Rfl:    amLODipine (NORVASC) 5 MG tablet, amlodipine 5 mg tablet  1 TABLET ORALLY ONCE A DAY, DR REDDY BRAND 90 DAYS, Disp: , Rfl:    cholecalciferol (VITAMIN D) 1000 UNITS tablet, Take 1,000 Units by mouth daily., Disp: , Rfl:    Cyanocobalamin (VITAMIN B 12 PO), Take by mouth as needed., Disp: , Rfl:    donepezil (ARICEPT) 10 MG tablet, TAKE 1/2 TABLET DAILY X 2 WEEKS THEN INCREASE TO FULL TABLET, Disp: 90 tablet, Rfl: 3   fluticasone (FLONASE) 50 MCG/ACT nasal spray, Place 2 sprays into both nostrils daily., Disp: , Rfl:    ibandronate (BONIVA) 150 MG tablet, Take 150 mg by mouth every 30 (thirty) days., Disp: , Rfl:    SYNTHROID 75 MCG tablet, Take 1 tablet by mouth daily., Disp: , Rfl:   Clinical Interview:   The following information was obtained during a clinical interview with Ms. Trindle and her daughter prior to cognitive testing.  Cognitive Symptoms: Decreased short-term memory: Endorsed. Previously, primary difficulties surrounded word finding where she cannot recall certain words she wishes to use in conversation. In turn, this has created escalating anxiety and diminished performance in the past. This was said to be stable. Her daughter described her and family held concerns surrounding trouble  recalling names and details of recent conversations, as well as increased repetition in day-to-day conversation. Difficulties have been present for the past 1-2 years and have seemed stable over time. Decreased long-term memory: Denied. Decreased attention/concentration: Denied. Reduced processing speed: Denied. Difficulties with executive functions: Denied. They also denied trouble with impulsivity or any more significant personality changes.  Difficulties with emotion regulation: Denied. Difficulties with receptive language: Denied. Difficulties with word finding: Endorsed. Decreased visuoperceptual ability: Denied.   Difficulties completing ADLs: Denied.  Additional Medical History: History of traumatic brain injury/concussion: Denied. History of stroke: Denied. History of seizure activity: Denied. History of known exposure to toxins: Denied. Symptoms of chronic pain: Denied. Experience of frequent headaches/migraines: Denied. Frequent instances of dizziness/vertigo: Denied.   Sensory changes: She wears glasses with benefit. Other sensory changes/difficulties (e.g., hearing, taste, smell) were denied.  Balance/coordination difficulties: Denied. She also denied any recent falls.  Other motor difficulties: Denied. Some shakiness was previously said to be present during periods of heightened anxiety.   Sleep History: Estimated hours obtained each night: 8 hours.  Difficulties falling asleep: Denied. Difficulties staying asleep: Denied. Feels rested and refreshed upon awakening: Endorsed.   History of snoring: Unclear.  History of waking up gasping for air: Denied. Witnessed breath cessation while asleep: Denied.   History of vivid dreaming: Denied. Excessive movement while asleep: Denied. Instances of acting out her dreams: Denied.  Psychiatric/Behavioral Health History: Depression: She described her current mood as "pretty good" and denied to her knowledge any prior mental  health concerns or formal diagnoses. She previously acknowledged some frustration surrounding word finding difficulties and her perception of some cognitive decline. She described her faith as her primary coping mechanism. Current or remote suicidal ideation, intent, or plan was denied.   Anxiety: Endorsed. However, symptoms appear to be noticeably diminished relative to her previous evaluation.  While she still takes Xanax as needed, she has resorted to taking this medication much less frequently. Mania: Denied. Trauma History: Denied. Visual/auditory hallucinations: Denied. Delusional thoughts: Denied.   Tobacco: Denied. Alcohol: She denied current alcohol consumption as well as a history of problematic alcohol abuse or dependence.  Recreational drugs: Denied.  Family History: Problem Relation Age of Onset   Hypertension Mother    Kidney failure Mother    Heart disease Father    Heart disease Brother    Prostate cancer Brother    Heart disease Sister    This information was confirmed by Ms. Senaida Ores.  Academic/Vocational History: Highest level of educational attainment: 12 years. She graduated from high school and described herself as an average (A/B/C) student in academic settings. No relative weaknesses were identified.  History of developmental delay: Denied. History of grade repetition: Denied. Enrollment in special education courses: Denied. History of LD/ADHD: Denied.   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 She also denied any recent negative performance reviews. Her daughter was in agreement, emphasizing that Ms. Rufus's work is very structured and routine-based.   Evaluation Results:   Behavioral Observations: Ms. Huisman was accompanied by her daughter, arrived to her appointment on time, and was appropriately  dressed and groomed. She appeared alert. Observed gait and station were within normal limits. Gross motor functioning appeared intact upon informal observation and no abnormal movements (e.g., tremors) were noted. Her affect was generally relaxed and positive. Spontaneous speech was fluent and word finding difficulties were not observed during the clinical interview. Thought processes were coherent, organized, and normal in content. Insight into her cognitive difficulties appeared poor and I do not believe that Ms. Gideon has appropriate insight into the extent of ongoing cognitive impairment.   During testing, sustained attention was appropriate. Set loss errors were quite notable throughout. This was especially exhibited across fluency tasks, where she frequently lost set and provided numerous incorrect responses. This was primarily seen across semantic fluency tasks (e.g., responses when asked to state animals included "fly, snake, cow, bird, person, friend, mother, grandmother, sister, brother, sky is falling, balls, new shoes, clothes, home, visit grandmother, blue, yellow, green." Task engagement was adequate and she persisted when challenged. Overall, Ms. Caress was cooperative with the clinical interview and subsequent testing procedures.   Adequacy of Effort: The validity of neuropsychological testing is limited by the extent to which the individual being tested may be assumed to have exerted adequate effort during testing. Ms. Schleisman expressed her intention to perform to the best of her abilities and exhibited adequate task engagement and persistence. Scores across stand-alone and embedded performance validity measures were variable but largely within expectation. Her sole below expectation performance is believed to be due to true, severe cognitive impairment rather than poor engagement or attempts to perform poorly. As such, the results of the current evaluation are believed to be a  valid representation of Ms. Fluharty's current cognitive functioning.  Test Results: Ms. Hendley was poorly oriented at the time of the current evaluation. She was unable to state the year ("1968"), month, date, time (she stated the current time was 4:00 despite her having an 8:30am appointment), and name of the current clinic. When asked why she was here, she responded "learn something and think about things."  Intellectual abilities based upon educational and vocational attainment were estimated to be in the average range. Premorbid abilities  were estimated to be within the well below average range based upon a single-word reading test.   Processing speed was largely average. She did exhibit an isolated impairment across a rapid symbol decoding task (RBANS Coding). Basic attention was average. More complex attention (e.g., working memory) was variable, ranging from the exceptionally low to average normative ranges. Executive functioning was exceptionally low. As stated above, set loss errors were extremely prominent throughout testing. She performed in the exceptionally low range across a task assessing safety and judgment. While many points were lost due to vague responses, some responses were concerning for poor judgment. For example, when asked what one should do if they take too much of a prescription medication, she responded "stop doing it, make sure I'm safe."  Assessed receptive language abilities were exceptionally low. She exhibited difficulties across all aspects of this task, including trouble understanding some conceptual information (e.g., she pointed to a square when asked to point to a red triangle), trouble understanding more complex sentence structure, and trouble following multi-step commands. Assessed expressive language (e.g., verbal fluency and confrontation naming) was exceptionally low to below average. Her most prominent dysfunction surrounded semantic fluency and  confrontation naming.     Assessed visuospatial/visuoconstructional abilities were variable, ranging from the exceptionally low to average normative ranges. Her drawing of a clock was notable for mild numerical spacing irregularities. When asked to place the clock hands, she drew five very small hash marks in between the numbers 10 and 11.    Learning (i.e., encoding) of novel verbal information was exceptionally low. Spontaneous delayed recall (i.e., retrieval) of previously learned information was exceptionally low to well below average. Retention rates were 0% across a story learning task, 0% across a list learning task, and 33% across a figure drawing task. Performance across recognition tasks was average across a visual task but exceptionally low across both verbal tasks, suggesting limited evidence for information consolidation.   Results of emotional screening instruments suggested that recent symptoms of generalized anxiety were in the minimal range, while symptoms of depression were within normal limits. A screening instrument assessing recent sleep quality suggested the presence of minimal sleep dysfunction.  Tables of Scores:   Note: This summary of test scores accompanies the interpretive report and should not be considered in isolation without reference to the appropriate sections in the text. Descriptors are based on appropriate normative data and may be adjusted based on clinical judgment. Terms such as "Within Normal Limits" and "Outside Normal Limits" are used when a more specific description of the test score cannot be determined. Descriptors refer to the current evaluation only.        Percentile - Normative Descriptor > 98 - Exceptionally High 91-97 - Well Above Average 75-90 - Above Average 25-74 - Average 9-24 - Below Average 2-8 - Well Below Average < 2 - Exceptionally Low        Validity: October 2023 Current  DESCRIPTOR        DCT: --- --- --- Within Normal Limits   RBANS EI: --- --- --- Outside Normal Limits  WAIS-IV RDS: --- --- --- Within Normal Limits        Orientation:       Raw Score Raw Score Percentile   NAB Orientation, Form 1 27/29 18/29 --- ---        Cognitive Screening:       Raw Score Raw Score Percentile   SLUMS: 8/30 6/30 --- ---        RBANS,  Form A: Standard Score/ Scaled Score Standard Score/ Scaled Score Percentile   Total Score 52 51 <1 Exceptionally Low  Immediate Memory 40 40 <1 Exceptionally Low    List Learning 1 1 <1 Exceptionally Low    Story Memory 1 1 <1 Exceptionally Low  Visuospatial/Constructional 78 87 19 Below Average    Figure Copy 8 10 50 Average    Line Orientation 10/20 12/20 3-9 Well Below Average  Language 74 47 <1 Exceptionally Low    Picture Naming 9/10 6/10 <2 Exceptionally Low    Semantic Fluency 1 1 <1 Exceptionally Low  Attention 64 79 8 Well Below Average    Digit Span 5 10 50 Average    Coding 4 3 1  Exceptionally Low  Delayed Memory 40 40 <1 Exceptionally Low    List Recall 0/10 0/10 <2 Exceptionally Low    List Recognition 12/20 11/20 <2 Exceptionally Low    Story Recall 1 1 <1 Exceptionally Low    Story Recognition 5/12 4/12 <1 Exceptionally Low    Figure Recall 3 5 5  Well Below Average    Figure Recognition 0/8 6/8 30-52 Average         Intellectual Functioning:       Standard Score Standard Score Percentile   Barona Formula Estimated Premorbid IQ: 88 90 25 Average         Standard Score Standard Score Percentile   Test of Premorbid Functioning: 73 71 3 Well Below Average        Attention/Executive Function:      Trail Making Test (TMT): Raw Score (T Score) Raw Score (T Score) Percentile     Part A 53 secs.,  0 errors (41) 35 secs.,  0 errors (54) 66 Average    Part B 230 secs.,  3 errors (39) Discontinued --- Impaired          Scaled Score Scaled Score Percentile   WAIS-IV Digit Span: 5 4 2  Well Below Average    Forward 8 8 25  Average    Backward 5 8 25  Average     Sequencing 6 1 <1 Exceptionally Low         Scaled Score Scaled Score Percentile   WAIS-IV Similarities: 2 2 <1 Exceptionally Low        D-KEFS Color-Word Interference Test: Raw Score (Scaled Score) Raw Score (Scaled Score) Percentile     Color Naming 40 secs. (7) 37 secs. (8) 25 Average    Word Reading 26 secs. (9) 27 secs. (9) 37 Average    Inhibition 176 secs. (1) Discontinued --- Impaired      Total Errors 26 errors (1) --- -- ---    Inhibition/Switching 88 secs. (9) Discontinued --- Impaired      Total Errors 17 errors (1) --- --- ---        D-KEFS Verbal Fluency Test: Raw Score (Scaled Score) Raw Score (Scaled Score) Percentile     Letter Total Correct 27 (7) 18 (5) 5 Well Below Average    Category Total Correct 29 (8) 14 (2) <1 Exceptionally Low    Category Switching Total Correct 3 (1) 3 (1) <1 Exceptionally Low    Category Switching Accuracy 1 (1) 2 (1) <1 Exceptionally Low      Total Set Loss Errors 7 (4) 26 (1) <1 Exceptionally Low      Total Repetition Errors 11 (2) 8 (5) 5 Well Below Average        NAB Executive Functions Module, Form 1:  T Score T Score Percentile     Judgment 36 22 <1 Exceptionally Low        Language:      Verbal Fluency Test: Raw Score (T Score) Raw Score (T Score) Percentile     Phonemic Fluency (FAS) 27 (47) 18 (39) 14 Below Average    Animal Fluency 10 (37) 5 (25) 1 Exceptionally Low         NAB Language Module, Form 1: T Score T Score Percentile     Auditory Comprehension 28 19 <1 Exceptionally Low    Naming 17/31 (19) 9/31 (19) <1 Exceptionally Low        Visuospatial/Visuoconstruction:       Raw Score Raw Score Percentile   Clock Drawing: 4/10 5/10 --- Impaired         Scaled Score Scaled Score Percentile   WAIS-IV Block Design: 6 5 5  Well Below Average  WAIS-IV Matrix Reasoning: 5 6 9  Below Average        Mood and Personality:       Raw Score Raw Score Percentile   Beck Depression Inventory - II: 1 0 --- Within Normal Limits   PROMIS Anxiety Questionnaire: 16 8 --- None to Slight        Additional Questionnaires:       Raw Score Raw Score Percentile   PROMIS Sleep Disturbance Questionnaire: 9 8 --- None to Slight   Informed Consent and Coding/Compliance:   The current evaluation represents a clinical evaluation for the purposes previously outlined by the referral source and is in no way reflective of a forensic evaluation.   Ms. Divito was provided with a verbal description of the nature and purpose of the present neuropsychological evaluation. Also reviewed were the foreseeable risks and/or discomforts and benefits of the procedure, limits of confidentiality, and mandatory reporting requirements of this provider. The patient was given the opportunity to ask questions and receive answers about the evaluation. Oral consent to participate was provided by the patient.   This evaluation was conducted by Newman Nickels, Ph.D., ABPP-CN, board certified clinical neuropsychologist. Ms. Paulos completed a clinical interview with Dr. Milbert Coulter, billed as one unit [phone], and 135 minutes of cognitive testing and scoring, billed as one unit [phone] and four additional units [id number]. Psychometrist Shan Levans, B.S. assisted Dr. Milbert Coulter with test administration and scoring procedures. As a separate and discrete service, one unit [id number] and two units [phone] were billed for Dr. Tammy Sours time spent in interpretation and report writing.

## 2023-05-12 NOTE — Progress Notes (Signed)
   Psychometrician Note   Cognitive testing was administered to Mary Taylor by Shan Levans, B.S. (psychometrist) under the supervision of Dr. Newman Nickels, Ph.D., licensed psychologist on 05/12/2023. Mary Taylor did not appear overtly distressed by the testing session per behavioral observation or responses across self-report questionnaires. Rest breaks were offered.    The battery of tests administered was selected by Dr. Newman Nickels, Ph.D. with consideration to Mary Taylor's current level of functioning, the nature of her symptoms, emotional and behavioral responses during interview, level of literacy, observed level of motivation/effort, and the nature of the referral question. This battery was communicated to the psychometrist. Communication between Dr. Newman Nickels, Ph.D. and the psychometrist was ongoing throughout the evaluation and Dr. Newman Nickels, Ph.D. was immediately accessible at all times. Dr. Newman Nickels, Ph.D. provided supervision to the psychometrist on the date of this service to the extent necessary to assure the quality of all services provided.    Mary Taylor will return within approximately 1-2 weeks for an interactive feedback session with Dr. Milbert Coulter at which time her test performances, clinical impressions, and treatment recommendations will be reviewed in detail. Mary Taylor understands she can contact our office should she require our assistance before this time.  A total of 135 minutes of billable time were spent face-to-face with Mary Taylor by the psychometrist. This includes both test administration and scoring time. Billing for these services is reflected in the clinical report generated by Dr. Newman Nickels, Ph.D.  This note reflects time spent with the psychometrician and does not include test scores or any clinical interpretations made by Dr. Milbert Coulter. The full report will follow in a separate note.

## 2023-05-17 ENCOUNTER — Ambulatory Visit: Payer: Medicare HMO | Admitting: Psychology

## 2023-05-17 DIAGNOSIS — F03A4 Unspecified dementia, mild, with anxiety: Secondary | ICD-10-CM

## 2023-05-17 NOTE — Progress Notes (Signed)
   Neuropsychology Feedback Session [name]. [facility] [place] Department of Neurology  Reason for Referral:   Mary Taylor is a 67 y.o. right-handed African-American female referred by Marlowe Kays, PA-C, to characterize her current cognitive functioning and assist with diagnostic clarity and treatment planning in the context of a prior amnestic mild cognitive impairment diagnosis and concerns for progressive cognitive decline.   Feedback:   [name] completed a comprehensive neuropsychological evaluation on 05/12/2023. Please refer to that encounter for the full report and recommendations. Briefly, results suggested fairly diffuse cognitive impairment. Performances were generally appropriate relative to age-matched peers across processing speed, basic attention, and phonemic fluency, while some performance variability was exhibited across complex attention and visuospatial abilities. However, severe impairment was exhibited across all other domains. This includes executive functioning, safety/judgment, receptive language, semantic fluency, confrontation naming, and essentially all aspects of learning and memory. Relative to her previous evaluation in October 2023, mild decline was exhibited across executive functioning, receptive language, semantic fluency, and confrontation naming. More subtle decline could be argued across complex attention. Severe verbal memory impairment with 0% retention after brief delays was exhibited across both evaluations. Regarding the underlying cause of her mild dementia presentation, I continue to have primary concerns surrounding an early-onset Alzheimer's disease presentation. Across memory tasks, [name] did not benefit from repeated exposure to novel information. After a brief delay, she was fully amnestic (i.e., 0% retention) across both verbal memory tasks. She only exhibited 33% retention across the remaining visual memory task.  Outside of an adequate performance across a visual memory recognition task, performance across two verbal measures was very poor. Overall, memory performances suggest evidence for rapid forgetting and an evolving and already quite significant storage impairment, both of which are the hallmark testing patterns for this illness. Further impairment with evidence of negative progression over the past 11 months surrounding semantic fluency, confrontation naming, and executive functioning follows typical and expected disease progression, further worsening current concerns. Despite the relative rarity of this condition at [name]'s age, I continue to view this illness as a plausible and reasonable explanation for ongoing deficits.   [name] was accompanied by her daughters during the current feedback session. Content of the current session focused on the results of her neuropsychological evaluation. [name] was given the opportunity to ask questions and her questions were answered. She was encouraged to reach out should additional questions arise. A copy of her report was provided at the conclusion of the visit.      One unit [phone] was billed for Dr. Tammy Sours time spent preparing for, conducting, and documenting the current feedback session with [name].

## 2023-06-01 DIAGNOSIS — I10 Essential (primary) hypertension: Secondary | ICD-10-CM | POA: Diagnosis not present

## 2023-06-01 DIAGNOSIS — E039 Hypothyroidism, unspecified: Secondary | ICD-10-CM | POA: Diagnosis not present

## 2023-06-01 DIAGNOSIS — F0280 Dementia in other diseases classified elsewhere without behavioral disturbance: Secondary | ICD-10-CM | POA: Diagnosis not present

## 2023-06-01 DIAGNOSIS — Z23 Encounter for immunization: Secondary | ICD-10-CM | POA: Diagnosis not present

## 2023-06-01 DIAGNOSIS — M810 Age-related osteoporosis without current pathological fracture: Secondary | ICD-10-CM | POA: Diagnosis not present

## 2023-06-01 DIAGNOSIS — N1831 Chronic kidney disease, stage 3a: Secondary | ICD-10-CM | POA: Diagnosis not present

## 2023-06-01 DIAGNOSIS — H9313 Tinnitus, bilateral: Secondary | ICD-10-CM | POA: Diagnosis not present

## 2023-06-01 DIAGNOSIS — E46 Unspecified protein-calorie malnutrition: Secondary | ICD-10-CM | POA: Diagnosis not present

## 2023-06-01 DIAGNOSIS — Z0000 Encounter for general adult medical examination without abnormal findings: Secondary | ICD-10-CM | POA: Diagnosis not present

## 2023-06-01 DIAGNOSIS — D869 Sarcoidosis, unspecified: Secondary | ICD-10-CM | POA: Diagnosis not present

## 2023-06-01 DIAGNOSIS — E785 Hyperlipidemia, unspecified: Secondary | ICD-10-CM | POA: Diagnosis not present

## 2023-08-02 DIAGNOSIS — M8588 Other specified disorders of bone density and structure, other site: Secondary | ICD-10-CM | POA: Diagnosis not present

## 2023-08-02 DIAGNOSIS — R29890 Loss of height: Secondary | ICD-10-CM | POA: Diagnosis not present

## 2023-08-19 DIAGNOSIS — Z1231 Encounter for screening mammogram for malignant neoplasm of breast: Secondary | ICD-10-CM | POA: Diagnosis not present

## 2023-09-01 DIAGNOSIS — I10 Essential (primary) hypertension: Secondary | ICD-10-CM | POA: Diagnosis not present

## 2023-09-01 DIAGNOSIS — F0280 Dementia in other diseases classified elsewhere without behavioral disturbance: Secondary | ICD-10-CM | POA: Diagnosis not present

## 2023-09-01 DIAGNOSIS — N1831 Chronic kidney disease, stage 3a: Secondary | ICD-10-CM | POA: Diagnosis not present

## 2023-09-01 DIAGNOSIS — I7090 Unspecified atherosclerosis: Secondary | ICD-10-CM | POA: Diagnosis not present

## 2023-09-02 DIAGNOSIS — Z008 Encounter for other general examination: Secondary | ICD-10-CM | POA: Diagnosis not present

## 2023-09-07 ENCOUNTER — Encounter: Payer: Self-pay | Admitting: Physician Assistant

## 2023-09-07 ENCOUNTER — Other Ambulatory Visit: Payer: Medicare HMO

## 2023-09-07 ENCOUNTER — Ambulatory Visit (INDEPENDENT_AMBULATORY_CARE_PROVIDER_SITE_OTHER): Payer: Medicare HMO | Admitting: Physician Assistant

## 2023-09-07 VITALS — BP 133/73 | HR 82 | Resp 20 | Ht 66.0 in | Wt 118.0 lb

## 2023-09-07 DIAGNOSIS — F03A4 Unspecified dementia, mild, with anxiety: Secondary | ICD-10-CM | POA: Diagnosis not present

## 2023-09-07 DIAGNOSIS — R413 Other amnesia: Secondary | ICD-10-CM | POA: Diagnosis not present

## 2023-09-07 MED ORDER — DONEPEZIL HCL 10 MG PO TABS: ORAL_TABLET | ORAL | 3 refills | Status: DC

## 2023-09-07 NOTE — Progress Notes (Signed)
 Assessment/Plan:   Dementia with anxiety of unclear etiology, concern for Alzheimer's disease   Mary Taylor is a very pleasant 68 y.o. RH female with a history of hypertension, hyperlipidemia, nonalcoholic liver disease, hypothyroidism, sarcoidosis, multiple pulmonary nodules likely due to sarcoidosis, social anxiety, history of migraine headaches, vitamin B12 deficiency  and a diagnosis of mild dementia with anxiety, with concern for early onset Alzheimer's disease per neuropsych evaluation September 2024, seen today in follow up for memory loss. Patient is currently on donepezil  10 mg daily. Memory is stable, MMSE today is 23/30.  Discussed proceeding with biomarkers to confirm or rule out the possibility of Alzheimer's disease.  Patient agrees with the plan.  She continues to perform her ADLs, drive and work. Mood is anxious but controlled.     Follow up in  6 months. Continue donepezil  10 mg daily, side effects discussed Continue B12 supplements Pertinent labs to evaluate AD    Recommend good control of her cardiovascular risk factors Continue to control mood as per PCP Monitor driving    Subjective:    This patient is accompanied in the office by her daughter who supplements the history.  Previous records as well as any outside records available were reviewed prior to todays visit. Patient was last seen on July 2024 with an MMSE 23/30   Any changes in memory since last visit?   She continues to report that she has to think harder especially when waking up , at which time she has a hard time putting the words together.  She is able to remember recent conversations and names of people that she knows. likes to do crossword puzzles and  word finding. She continues to work and denies any difficulties at the job. repeats oneself?  Endorsed, not frequently Disoriented when walking into a room?  Patient denies.  Leaving objects?  May misplace things such as the wallet but not in  unusual places  Wandering behavior?  denies   Any personality changes since last visit?  She has a history of depression and anxiety.  Any worsening depression?:  Denies.   Hallucinations or paranoia?  Denies.   Seizures? denies    Any sleep changes?  Denies vivid dreams, REM behavior or sleepwalking   Sleep apnea?   Denies.   Any hygiene concerns? Denies.  Independent of bathing and dressing?  Endorsed  Does the patient needs help with medications?  Patient is in charge   Who is in charge of the finances?  Patient is in charge     Any changes in appetite?  Denies.     Patient have trouble swallowing? Denies.   Does the patient cook? No Any headaches?   Denies.   Chronic back pain  denies.   Ambulates with difficulty?  She has multiple areas of joint pain due to sarcoidosis which may limit her mobility, this is followed by rheumatology.    Recent falls or head injuries? Denies.     Unilateral weakness, numbness or tingling?  She has a history of peripheral neuropathy, takes B12 with some relief Any tremors?  Denies   Any anosmia?  Denies   Any incontinence of urine?  Denies  Any bowel dysfunction?   Denies      Patient lives with her daughter Does the patient drive?  Yes, denies getting lost, daughter reports that sometimes she may be lost. 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 Performances were generally appropriate relative to age-matched peers across processing speed, basic attention, and phonemic fluency, while some performance variability was exhibited across complex attention and visuospatial abilities. However, severe impairment was exhibited across all other domains. This includes executive functioning, safety/judgment, receptive language, semantic fluency, confrontation naming, and essentially all aspects of learning and memory. Relative to her previous  evaluation in October 2023, mild decline was exhibited across executive functioning, receptive language, semantic fluency, and confrontation naming. More subtle decline could be argued across complex attention. Severe verbal memory impairment with 0% retention after brief delays was exhibited across both evaluations. Regarding the underlying cause of her mild dementia presentation, I continue to have primary concerns surrounding an early-onset Alzheimer's disease presentation. Across memory tasks, Ms. Kneale did not benefit from repeated exposure to novel information. After a brief delay, she was fully amnestic (i.e., 0% retention) across both verbal memory tasks. She only exhibited 33% retention across the remaining visual memory task. Outside of an adequate performance across a visual memory recognition task, performance across two verbal measures was very poor. Overall, memory performances suggest evidence for rapid forgetting and an evolving and already quite significant storage impairment, both of which are the hallmark testing patterns for this illness. Further impairment with evidence of negative progression over the past 11 months surrounding semantic fluency, confrontation naming, and executive functioning follows typical and expected disease progression, further worsening current concerns. Despite the relative rarity of this condition at Ms. Henton's age, I continue to view this illness as a plausible and reasonable explanation for ongoing deficits.   PREVIOUS MEDICATIONS:   CURRENT MEDICATIONS:  Outpatient Encounter Medications as of 09/07/2023  Medication Sig   Acetaminophen 500 MG capsule 1 capsule as needed Orally every 4 hrs   albuterol  (VENTOLIN  HFA) 108 (90 Base) MCG/ACT inhaler Inhale 1-2 puffs into the lungs every 6 (six) hours as needed for wheezing or shortness of breath.   ALPRAZolam (XANAX) 0.5 MG tablet 1 tablet Orally Twice a day, as needed for anxiety for 30 days    amLODipine (NORVASC) 5 MG tablet amlodipine 5 mg tablet  1 TABLET ORALLY ONCE A DAY, DR REDDY BRAND 90 DAYS   fluticasone (FLONASE) 50 MCG/ACT nasal spray Place 2 sprays into both nostrils daily.   ibandronate (BONIVA) 150 MG tablet Take 150 mg by mouth every 30 (thirty) days.   pravastatin (PRAVACHOL) 20 MG tablet Take 20 mg by mouth daily.   SYNTHROID 75 MCG tablet Take 1 tablet by mouth daily.   [DISCONTINUED] donepezil  (ARICEPT ) 10 MG tablet TAKE 1/2 TABLET DAILY X 2 WEEKS THEN INCREASE TO FULL TABLET   cholecalciferol (VITAMIN D ) 1000 UNITS tablet Take 1,000 Units by mouth daily.   Cyanocobalamin (VITAMIN B 12 PO) Take by mouth as needed.   donepezil  (ARICEPT ) 10 MG tablet TAKE one tablet daily   No facility-administered encounter medications on file as of 09/07/2023.       09/07/2023    9:00 AM 03/05/2023   12:00 PM 09/07/2022   10:00 AM  MMSE - Mini Mental State Exam  Orientation to time 4 4 3   Orientation to Place 3 4 5   Registration 3 3 3   Attention/ Calculation 4 4 3   Recall 0 0 0  Language- name 2 objects 2 2 2   Language- repeat 1 1 1   Language- follow 3 step command 3 3 3   Language- read & follow direction 1 1 1   Write a sentence 1 0 1  Copy design 1 1 1   Total score 23 23 23       03/06/2022   12:00 PM 02/02/2022    9:00 AM  Montreal Cognitive Assessment   Visuospatial/ Executive (0/5) 3 1  Naming (0/3) 2 2  Attention: Read list of digits (0/2) 1 2  Attention: Read list of letters (0/1) 1 1  Attention: Serial 7 subtraction starting at 100 (0/3) 0 0  Language: Repeat phrase (0/2) 1 1  Language : Fluency (0/1) 1 0  Abstraction (0/2) 1 0  Delayed Recall (0/5) 1 0  Orientation (0/6) 5 2  Total 16 9  Adjusted Score (based on education) 16 9    Objective:     PHYSICAL EXAMINATION:    VITALS:   Vitals:   09/07/23 0849  BP: 133/73  Pulse: 82  Resp: 20  SpO2: 96%  Weight: 118 lb (53.5 kg)  Height: 5' 6 (1.676 m)    GEN:  The patient appears stated age  and is in NAD. HEENT:  Normocephalic, atraumatic.   Neurological examination:  General: NAD, well-groomed, appears stated age. Orientation: The patient is alert. Oriented to person, place and not to date Cranial nerves: There is good facial symmetry. Anxious appearing. The speech is fluent and clear, but tangential at times. No aphasia or dysarthria. Fund of knowledge is appropriate. Recent and remote memory are impaired. Attention and concentration are reduced.  Able to name objects and repeat phrases.  Hearing is intact to conversational tone. Sensation: Sensation is intact to light touch throughout Motor: Strength is at least antigravity x4. DTR's 2/4 in UE/LE     Movement examination: Tone: There is normal tone in the UE/LE Abnormal movements:  no tremor.  No myoclonus.  No asterixis.   Coordination:  There is no decremation with RAM's. Normal finger to nose  Gait and Station: The patient has no difficulty arising out of a deep-seated chair without the use of the hands. The patient's stride length is good.  Gait is cautious and narrow.    Thank you for allowing us  the opportunity to participate in the care of this nice patient. Please do not hesitate to contact us  for any questions or concerns.   Total time spent on today's visit was 35 minutes dedicated to this patient today, preparing to see patient, examining the patient, ordering tests and/or medications and counseling the patient, documenting clinical information in the EHR or other health record, independently interpreting results and communicating results to the patient/family, discussing treatment and goals, answering patient's questions and coordinating care.  Cc:  Elliot Charm, MD  Camie Sevin 09/07/2023 9:41 AM

## 2023-09-07 NOTE — Patient Instructions (Addendum)
 It was a pleasure to see you today at our office.   Recommendations: Labs today [address] Follow up in 6  months Continue Donepezil  as prescribed.  Continue B12  Will entertain molecular lbs pending on insurance     Whom to call:  Memory  decline, memory medications: Call our office [phone]   For psychiatric meds, mood meds: Please have your primary care physician manage these medications.      For assessment of decision of mental capacity and competency:  Call Dr. Rosaline Nine, geriatric psychiatrist at [phone]  For guidance in geriatric dementia issues please call [facility] [phone]    If you have any severe symptoms of a stroke, or other severe issues such as confusion,severe chills or fever, etc call 911 or go to the ER as you may need to be evaluated further       RECOMMENDATIONS FOR ALL PATIENTS WITH MEMORY PROBLEMS: 1. Continue to exercise (Recommend 30 minutes of walking everyday, or 3 hours every week) 2. Increase social interactions - continue going to [place] and enjoy social gatherings with friends and family 3. Eat healthy, avoid fried foods and eat more fruits and vegetables 4. Maintain adequate blood pressure, blood sugar, and blood cholesterol level. Reducing the risk of stroke and cardiovascular disease also helps promoting better memory. 5. Avoid stressful situations. Live a simple life and avoid aggravations. Organize your time and prepare for the next day in anticipation. 6. Sleep well, avoid any interruptions of sleep and avoid any distractions in the bedroom that may interfere with adequate sleep quality 7. Avoid sugar, avoid sweets as there is a strong link between excessive sugar intake, diabetes, and cognitive impairment We discussed the Mediterranean diet, which has been shown to help patients reduce the risk of progressive memory disorders and reduces cardiovascular risk. This includes eating fish, eat fruits and green leafy  vegetables, nuts like almonds and hazelnuts, walnuts, and also use olive oil. Avoid fast foods and fried foods as much as possible. Avoid sweets and sugar as sugar use has been linked to worsening of memory function.  There is always a concern of gradual progression of memory problems. If this is the case, then we may need to adjust level of care according to patient needs. Support, both to the patient and caregiver, should then be put into place.    FALL PRECAUTIONS: Be cautious when walking. Scan the area for obstacles that may increase the risk of trips and falls. When getting up in the mornings, sit up at the edge of the bed for a few minutes before getting out of bed. Consider elevating the bed at the head end to avoid drop of blood pressure when getting up. Walk always in a well-lit room (use night lights in the walls). Avoid area rugs or power cords from appliances in the middle of the walkways. Use a walker or a cane if necessary and consider physical therapy for balance exercise. Get your eyesight checked regularly.  FINANCIAL OVERSIGHT: Supervision, especially oversight when making financial decisions or transactions is also recommended.  HOME SAFETY: Consider the safety of the kitchen when operating appliances like stoves, microwave oven, and blender. Consider having supervision and share cooking responsibilities until no longer able to participate in those. Accidents with firearms and other hazards in the house should be identified and addressed as well.   ABILITY TO BE LEFT ALONE: If patient is unable to contact 911 operator, consider using [employer], or when the need is  there, arrange for someone to stay with patients. Smoking is a fire hazard, consider supervision or cessation. Risk of wandering should be assessed by caregiver and if detected at any point, supervision and safe proof recommendations should be instituted.  MEDICATION SUPERVISION: Inability to self-administer medication  needs to be constantly addressed. Implement a mechanism to ensure safe administration of the medications.   DRIVING: Regarding driving, in patients with progressive memory problems, driving will be impaired. We advise to have someone else do the driving if trouble finding directions or if minor accidents are reported. Independent driving assessment is available to determine safety of driving.   If you are interested in the driving assessment, you can contact the following:  [facility] [employer] in [place] [phone]  [facility] [phone]  [facility] [phone]  [facility] [phone] or [phone]

## 2023-09-13 LAB — QUEST AD-DETECT APOLIPOPROTEIN E (APOE) ISOFORM, PLASMA

## 2023-09-13 LAB — QUEST AD-DETECT PHOSPHORYLATED TAU217(P-TAU217), PLASMA: Quest Detect PTAU217, Plasma: 0.64 pg/mL — ABNORMAL HIGH (ref <=0.15)

## 2023-09-13 LAB — QUEST AD-DETECT™, BETA-AMYLOID 42/40 RATIO, PLASMA
ABETA 40: 286 pg/mL
ABETA 42/40 RATIO: 0.157 (ref >=0.170)
ABETA 42: 45 pg/mL

## 2023-09-13 LAB — QUEST AD-DETECT® PHOSPHORYLATED TAU181(P-TAU181), PLASMA: QUEST AD DETECT PTAU181, PLASMA: 1.74 pg/mL — ABNORMAL HIGH (ref <=1.07)

## 2023-10-07 DIAGNOSIS — I7090 Unspecified atherosclerosis: Secondary | ICD-10-CM | POA: Diagnosis not present

## 2023-12-17 ENCOUNTER — Encounter: Payer: Self-pay | Admitting: Physician Assistant

## 2023-12-22 DIAGNOSIS — I7090 Unspecified atherosclerosis: Secondary | ICD-10-CM | POA: Diagnosis not present

## 2024-02-03 ENCOUNTER — Ambulatory Visit (HOSPITAL_COMMUNITY): Admission: RE | Admit: 2024-02-03 | Source: Ambulatory Visit

## 2024-02-21 DIAGNOSIS — I10 Essential (primary) hypertension: Secondary | ICD-10-CM | POA: Diagnosis not present

## 2024-02-21 DIAGNOSIS — E039 Hypothyroidism, unspecified: Secondary | ICD-10-CM | POA: Diagnosis not present

## 2024-02-21 DIAGNOSIS — M810 Age-related osteoporosis without current pathological fracture: Secondary | ICD-10-CM | POA: Diagnosis not present

## 2024-02-21 DIAGNOSIS — N1831 Chronic kidney disease, stage 3a: Secondary | ICD-10-CM | POA: Diagnosis not present

## 2024-03-06 ENCOUNTER — Ambulatory Visit: Admitting: Physician Assistant

## 2024-03-06 ENCOUNTER — Encounter: Payer: Self-pay | Admitting: Physician Assistant

## 2024-03-06 ENCOUNTER — Ambulatory Visit: Payer: Medicare HMO | Admitting: Physician Assistant

## 2024-03-06 VITALS — BP 142/73 | HR 70 | Resp 20 | Ht 66.0 in | Wt 120.0 lb

## 2024-03-06 DIAGNOSIS — F03A4 Unspecified dementia, mild, with anxiety: Secondary | ICD-10-CM | POA: Diagnosis not present

## 2024-03-06 MED ORDER — MEMANTINE HCL 5 MG PO TABS: ORAL_TABLET | ORAL | 3 refills | Status: DC

## 2024-03-06 MED ORDER — DONEPEZIL HCL 10 MG PO TABS: ORAL_TABLET | ORAL | 3 refills | Status: AC

## 2024-03-06 NOTE — Progress Notes (Signed)
 Assessment/Plan:   Mild Dementia concern for Alzheimer's disease   Mary Taylor is a very pleasant 68 y.o. RH female with a history of hypertension, hyperlipidemia, nonalcoholic liver disease, hypothyroidism, sarcoidosis, multiple pulmonary nodules likely due to sarcoidosis, social anxiety, history of migraine headaches, vitamin B12 deficiency  and a diagnosis of mild dementia with anxiety, with concern for early onset Alzheimer's disease per neuropsych evaluation September 2024 seen today in follow up for memory loss. Patient is currently on donepezil  10 mg daily. Discussed starting low dose memantine  given the recent MMSE scores,  in an effort to slow down cognitive decline, patient agrees.  Patient is able to participate on ADLs and to drive without significant difficulties.  Mood is improved after retiring      Follow up in  6  months. Continue donepezil  10 mg daily, side effects discussed  Start memantine  5 mg bid, side effects discussed Recommend good control of her cardiovascular risk factors Continue to control mood as per PCP Monitor driving     Subjective:    This patient is accompanied in the office by her daughter who supplements the history.  Previous records as well as any outside records available were reviewed prior to todays visit. Patient was last seen on 09/07/2023 with MMSE 23/30    Any changes in memory since last visit? She has been doing very well, especially since retiring. . She is able to speak more fluently, she is more conversant.  She is able to remember recent conversations and names of people that she knows. Soemtimes he my have trouble remembering certain words like  TV.  She likes doing crossword puzzles and word finding.  She visits her daughter on a daily basis.   repeats oneself?  Endorsed, not frequently, especially with appointments.  Disoriented when walking into a room? Denies   Leaving objects?  May misplace things such as the  pocketbook, still to be found (however, the wallet it was found)  Wandering behavior?  denies   Any personality changes since last visit?  Denies. Anxiety  and depression have improved since no longer working.  Hallucinations or paranoia?  Denies.   Seizures? denies    Any sleep changes?  Sleeps well very much.  Denies vivid dreams, REM behavior or sleepwalking   Sleep apnea?   Denies.   Any hygiene concerns? Denies.  Independent of bathing and dressing?  Endorsed  Does the patient needs help with medications?  Daughter  is in charge   Who is in charge of the finances?Daughter is in charge     Any changes in appetite?  Denies. Eats well.     Patient have trouble swallowing? Denies.   Does the patient cook? Very seldom. Denies any accidents  Any headaches?   Denies.   Any vision changes?Denies  Chronic back pain  denies   Ambulates with difficulty?  Multiple areas of joint pain due to sarcoidosis, limiting her mobility, followed by rheumatology.  Recent falls or head injuries? Denies.     Unilateral weakness, numbness or tingling?  She has a history of peripheral neuropathy. Any tremors?  Denies    Any anosmia?  Denies   Any incontinence of urine?  Endorsed   Any bowel dysfunction?   Denies      Patient lives with her daughter  Does the patient drive?  Yes, she denies getting lost     Neuropsych evaluation  04/2023 Briefly, results suggested fairly diffuse cognitive impairment. Performances were generally appropriate relative  to age-matched peers across processing speed, basic attention, and phonemic fluency, while some performance variability was exhibited across complex attention and visuospatial abilities. However, severe impairment was exhibited across all other domains. This includes executive functioning, safety/judgment, receptive language, semantic fluency, confrontation naming, and essentially all aspects of learning and memory. Relative to her previous evaluation in October  2023, mild decline was exhibited across executive functioning, receptive language, semantic fluency, and confrontation naming. More subtle decline could be argued across complex attention. Severe verbal memory impairment with 0% retention after brief delays was exhibited across both evaluations. Regarding the underlying cause of her mild dementia presentation, I continue to have primary concerns surrounding an early-onset Alzheimer's disease presentation. Across memory tasks, Ms. Pebley did not benefit from repeated exposure to novel information. After a brief delay, she was fully amnestic (i.e., 0% retention) across both verbal memory tasks. She only exhibited 33% retention across the remaining visual memory task. Outside of an adequate performance across a visual memory recognition task, performance across two verbal measures was very poor. Overall, memory performances suggest evidence for rapid forgetting and an evolving and already quite significant storage impairment, both of which are the hallmark testing patterns for this illness. Further impairment with evidence of negative progression over the past 11 months surrounding semantic fluency, confrontation naming, and executive functioning follows typical and expected disease progression, further worsening current concerns. Despite the relative rarity of this condition at Ms. Finelli's age, I continue to view this illness as a plausible and reasonable explanation for ongoing deficits.   PREVIOUS MEDICATIONS:   CURRENT MEDICATIONS:  Outpatient Encounter Medications as of 03/06/2024  Medication Sig   Acetaminophen 500 MG capsule 1 capsule as needed Orally every 4 hrs   albuterol  (VENTOLIN  HFA) 108 (90 Base) MCG/ACT inhaler Inhale 1-2 puffs into the lungs every 6 (six) hours as needed for wheezing or shortness of breath.   ALPRAZolam (XANAX) 0.5 MG tablet 1 tablet Orally Twice a day, as needed for anxiety for 30 days   amLODipine (NORVASC) 5 MG  tablet amlodipine 5 mg tablet  1 TABLET ORALLY ONCE A DAY, DR REDDY BRAND 90 DAYS   cholecalciferol (VITAMIN D ) 1000 UNITS tablet Take 1,000 Units by mouth daily.   Cyanocobalamin (VITAMIN B 12 PO) Take by mouth as needed.   fluticasone (FLONASE) 50 MCG/ACT nasal spray Place 2 sprays into both nostrils daily.   ibandronate (BONIVA) 150 MG tablet Take 150 mg by mouth every 30 (thirty) days.   memantine  (NAMENDA ) 5 MG tablet Take 1 tablet (5 mg at night) for 2 weeks, then increase to 1 tablet (5 mg) twice a day   pravastatin (PRAVACHOL) 20 MG tablet Take 20 mg by mouth daily.   SYNTHROID 75 MCG tablet Take 1 tablet by mouth daily.   [DISCONTINUED] donepezil  (ARICEPT ) 10 MG tablet TAKE one tablet daily   donepezil  (ARICEPT ) 10 MG tablet TAKE one tablet daily   No facility-administered encounter medications on file as of 03/06/2024.       03/06/2024    6:00 PM 09/07/2023    9:00 AM 03/05/2023   12:00 PM  MMSE - Mini Mental State Exam  Orientation to time 2 4 4   Orientation to Place 3 3 4   Registration 3 3 3   Attention/ Calculation 4 4 4   Recall 0 0 0  Language- name 2 objects 2 2 2   Language- repeat 1 1 1   Language- follow 3 step command 3 3 3   Language- read & follow direction 1  1 1  Write a sentence 1 1 0  Copy design 0 1 1  Total score 20 23 23       03/06/2022   12:00 PM 02/02/2022    9:00 AM  Montreal Cognitive Assessment   Visuospatial/ Executive (0/5) 3 1  Naming (0/3) 2 2  Attention: Read list of digits (0/2) 1 2  Attention: Read list of letters (0/1) 1 1  Attention: Serial 7 subtraction starting at 100 (0/3) 0 0  Language: Repeat phrase (0/2) 1 1  Language : Fluency (0/1) 1 0  Abstraction (0/2) 1 0  Delayed Recall (0/5) 1 0  Orientation (0/6) 5 2  Total 16 9  Adjusted Score (based on education) 16 9    Objective:     PHYSICAL EXAMINATION:    VITALS:   Vitals:   03/06/24 1107  BP: (!) 142/73  Pulse: 70  Resp: 20  SpO2: 98%  Weight: 120 lb (54.4 kg)   Height: 5' 6 (1.676 m)    GEN:  The patient appears stated age and is in NAD. HEENT:  Normocephalic, atraumatic.   Neurological examination:  General: NAD, well-groomed, appears stated age. Orientation: The patient is alert. Oriented to person, place and not to date Cranial nerves: There is good facial symmetry.anxious appearing.  The speech is fluent and clear. No aphasia or dysarthria. Fund of knowledge is appropriate. Recent and remote memory are impaired. Attention and concentration are reduced. Able to name objects and repeat phrases.  Hearing is intact to conversational tone.   Sensation: Sensation is intact to light touch throughout Motor: Strength is at least antigravity x4. DTR's 2/4 in UE/LE     Movement examination: Tone: There is normal tone in the UE/LE Abnormal movements:  no tremor.  No myoclonus.  No asterixis.   Coordination:  There is no decremation with RAM's. Normal finger to nose  Gait and Station: The patient has no difficulty arising out of a deep-seated chair without the use of the hands. The patient's stride length is good.  Gait is cautious and narrow.    Thank you for allowing us  the opportunity to participate in the care of this nice patient. Please do not hesitate to contact us  for any questions or concerns.   Total time spent on today's visit was 32 minutes dedicated to this patient today, preparing to see patient, examining the patient, ordering tests and/or medications and counseling the patient, documenting clinical information in the EHR or other health record, independently interpreting results and communicating results to the patient/family, discussing treatment and goals, answering patient's questions and coordinating care.  Cc:  Elliot Charm, MD  Camie Sevin 03/06/2024 6:58 PM

## 2024-03-06 NOTE — Patient Instructions (Signed)
 It was a pleasure to see you today at our office.   Recommendations:  Follow up in 6  months Continue Donepezil  as prescribed.  Start Memantine  5mg  tablets.  Take 1 tablet at bedtime for 2 weeks, then 1 tablet twice daily.     Continue B12     Whom to call:  Memory  decline, memory medications: Call our office [phone]   For psychiatric meds, mood meds: Please have your primary care physician manage these medications.      For assessment of decision of mental capacity and competency:  Call Dr. Rosaline Nine, geriatric psychiatrist at [phone]  For guidance in geriatric dementia issues please call [facility] [phone]    If you have any severe symptoms of a stroke, or other severe issues such as confusion,severe chills or fever, etc call 911 or go to the ER as you may need to be evaluated further       RECOMMENDATIONS FOR ALL PATIENTS WITH MEMORY PROBLEMS: 1. Continue to exercise (Recommend 30 minutes of walking everyday, or 3 hours every week) 2. Increase social interactions - continue going to [place] and enjoy social gatherings with friends and family 3. Eat healthy, avoid fried foods and eat more fruits and vegetables 4. Maintain adequate blood pressure, blood sugar, and blood cholesterol level. Reducing the risk of stroke and cardiovascular disease also helps promoting better memory. 5. Avoid stressful situations. Live a simple life and avoid aggravations. Organize your time and prepare for the next day in anticipation. 6. Sleep well, avoid any interruptions of sleep and avoid any distractions in the bedroom that may interfere with adequate sleep quality 7. Avoid sugar, avoid sweets as there is a strong link between excessive sugar intake, diabetes, and cognitive impairment We discussed the Mediterranean diet, which has been shown to help patients reduce the risk of progressive memory disorders and reduces cardiovascular risk. This includes eating fish,  eat fruits and green leafy vegetables, nuts like almonds and hazelnuts, walnuts, and also use olive oil. Avoid fast foods and fried foods as much as possible. Avoid sweets and sugar as sugar use has been linked to worsening of memory function.  There is always a concern of gradual progression of memory problems. If this is the case, then we may need to adjust level of care according to patient needs. Support, both to the patient and caregiver, should then be put into place.    FALL PRECAUTIONS: Be cautious when walking. Scan the area for obstacles that may increase the risk of trips and falls. When getting up in the mornings, sit up at the edge of the bed for a few minutes before getting out of bed. Consider elevating the bed at the head end to avoid drop of blood pressure when getting up. Walk always in a well-lit room (use night lights in the walls). Avoid area rugs or power cords from appliances in the middle of the walkways. Use a walker or a cane if necessary and consider physical therapy for balance exercise. Get your eyesight checked regularly.  FINANCIAL OVERSIGHT: Supervision, especially oversight when making financial decisions or transactions is also recommended.  HOME SAFETY: Consider the safety of the kitchen when operating appliances like stoves, microwave oven, and blender. Consider having supervision and share cooking responsibilities until no longer able to participate in those. Accidents with firearms and other hazards in the house should be identified and addressed as well.   ABILITY TO BE LEFT ALONE: If patient is unable to  contact 911 operator, consider using [facility], or when the need is there, arrange for someone to stay with patients. Smoking is a fire hazard, consider supervision or cessation. Risk of wandering should be assessed by caregiver and if detected at any point, supervision and safe proof recommendations should be instituted.  MEDICATION SUPERVISION: Inability to  self-administer medication needs to be constantly addressed. Implement a mechanism to ensure safe administration of the medications.   DRIVING: Regarding driving, in patients with progressive memory problems, driving will be impaired. We advise to have someone else do the driving if trouble finding directions or if minor accidents are reported. Independent driving assessment is available to determine safety of driving.   If you are interested in the driving assessment, you can contact the following:  [employer] in [place] [phone]  [facility] [phone]  [facility] [phone]  [facility] [phone] or [phone]

## 2024-03-23 DIAGNOSIS — I10 Essential (primary) hypertension: Secondary | ICD-10-CM | POA: Diagnosis not present

## 2024-03-23 DIAGNOSIS — E039 Hypothyroidism, unspecified: Secondary | ICD-10-CM | POA: Diagnosis not present

## 2024-03-23 DIAGNOSIS — N1831 Chronic kidney disease, stage 3a: Secondary | ICD-10-CM | POA: Diagnosis not present

## 2024-03-23 DIAGNOSIS — M810 Age-related osteoporosis without current pathological fracture: Secondary | ICD-10-CM | POA: Diagnosis not present

## 2024-04-13 ENCOUNTER — Ambulatory Visit: Admitting: Physician Assistant

## 2024-09-06 NOTE — Progress Notes (Incomplete)
 "   Mild dementia, concern for Alzheimer's disease   Mary Taylor is a very pleasant 69 y.o. RH female with a history of  hypertension, hyperlipidemia, nonalcoholic liver disease, hypothyroidism, sarcoidosis, multiple pulmonary nodules likely due to sarcoidosis, social anxiety, history of migraine headaches, vitamin B12 deficiency  and a diagnosis of mild dementia with anxiety, with concern for early onset Alzheimer's disease per neuropsych evaluation September 2024   presenting today in follow-up for evaluation of memory loss. Patient is on donepezil  10 mg daily and memantine  5 mg twice daily, tolerating well***. Patient was last seen on 03/06/2024***. Memory is ***. MMSE today is  /30. Patient is able to participate on ADLs and to to drive without difficulties. Mood is stable***. This patient is accompanied in the office by ***  who supplements the history. Previous records as well as any outside records available were reviewed prior to todays visit   Follow up in  *** months Continue donepezil  10 mg daily and memantine  10 mg twice daily, side effects discussed Continue to control mood as per PCP Recommend good control of cardiovascular risk factors Monitor driving      Discussed the use of AI scribe software for clinical note transcription with the patient, who gave verbal consent to proceed.  History of Present Illness     Any changes in memory since last visit? She has been doing very well, especially since retiring. . She is able to speak more fluently, she is more conversant.  She is able to remember recent conversations and names of people that she knows. Soemtimes he my have trouble remembering certain words like  TV.  She likes doing crossword puzzles and word finding.  She visits her daughter on a daily basis.   repeats oneself?  Endorsed, not frequently, especially with appointments.  Disoriented when walking into a room? Denies   Leaving objects?  May misplace things  such as the pocketbook, still to be found (however, the wallet it was found)  Wandering behavior?  denies   Any personality changes since last visit?  Denies. Anxiety  and depression have improved since no longer working.  Hallucinations or paranoia?  Denies.   Seizures? denies    Any sleep changes?  Sleeps well very much.  Denies vivid dreams, REM behavior or sleepwalking   Sleep apnea?   Denies.   Any hygiene concerns? Denies.  Independent of bathing and dressing?  Endorsed  Does the patient needs help with medications?  Daughter  is in charge   Who is in charge of the finances?Daughter is in charge     Any changes in appetite?  Denies. Eats well.     Patient have trouble swallowing? Denies.   Does the patient cook? Very seldom. Denies any accidents  Any headaches?   Denies.   Any vision changes?Denies  Chronic back pain  denies   Ambulates with difficulty?  Multiple areas of joint pain due to sarcoidosis, limiting her mobility, followed by rheumatology.  Recent falls or head injuries? Denies.     Unilateral weakness, numbness or tingling?  She has a history of peripheral neuropathy. Any tremors?  Denies    Any anosmia?  Denies   Any incontinence of urine?  Endorsed   Any bowel dysfunction?   Denies      Patient lives with her daughter  Does the patient drive?  Yes, she denies getting lost      Neuropsych evaluation  04/2023 Briefly, results suggested fairly diffuse cognitive impairment.  Performances were generally appropriate relative to age-matched peers across processing speed, basic attention, and phonemic fluency, while some performance variability was exhibited across complex attention and visuospatial abilities. However, severe impairment was exhibited across all other domains. This includes executive functioning, safety/judgment, receptive language, semantic fluency, confrontation naming, and essentially all aspects of learning and memory. Relative to her previous evaluation  in October 2023, mild decline was exhibited across executive functioning, receptive language, semantic fluency, and confrontation naming. More subtle decline could be argued across complex attention. Severe verbal memory impairment with 0% retention after brief delays was exhibited across both evaluations. Regarding the underlying cause of her mild dementia presentation, I continue to have primary concerns surrounding an early-onset Alzheimer's disease presentation. Across memory tasks, Ms. Wallenstein did not benefit from repeated exposure to novel information. After a brief delay, she was fully amnestic (i.e., 0% retention) across both verbal memory tasks. She only exhibited 33% retention across the remaining visual memory task. Outside of an adequate performance across a visual memory recognition task, performance across two verbal measures was very poor. Overall, memory performances suggest evidence for rapid forgetting and an evolving and already quite significant storage impairment, both of which are the hallmark testing patterns for this illness. Further impairment with evidence of negative progression over the past 11 months surrounding semantic fluency, confrontation naming, and executive functioning follows typical and expected disease progression, further worsening current concerns. Despite the relative rarity of this condition at Ms. Mcdiarmid's age, I continue to view this illness as a plausible and reasonable explanation for ongoing deficits.      Past Medical History:  Diagnosis Date   Acute upper respiratory infection 05/22/2014   Office Spirometry 12/18/16-WNL-FVC 3.07/103%, FEV1 2.40/102%, ratio 0.78, FEF 25-75% 2.40/106%   Chronic kidney disease, stage 3a 02/03/2023   Fatty liver disease, nonalcoholic 02/03/2023   Generalized anxiety disorder    Hyperlipidemia    Hypertension 01/25/2018   Pt is not taking Amlodipine which was prescribed to her pt states she was informed by previous  physician as long as she keep bp below 140 she was ok     Hypothyroidism    Left leg paresthesias 02/03/2023   Migraine    Mild dementia, concerns for early-onset Alzheimer's disease 05/12/2023   Muscle spasms of neck 09/19/2018   Musculoskeletal pain 03/15/2008   Osteopenia    Osteoporosis 01/25/2018   Bone Density/Pana/ 2017 or 2018   Plantar fasciitis of right foot 06/01/2019   Primary osteoarthritis, unspecified hand 02/03/2023   Sarcoidosis    Seasonal and perennial allergic rhinitis 03/14/2008   Social anxiety disorder 02/03/2023   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  GEN:  The patient appears stated age and is in NAD. HEENT:  Normocephalic, atraumatic.   Neurological examination:  General: NAD, well-groomed, appears stated age. Orientation: The patient is alert. Oriented to person, place and not to date.*** Cranial nerves: There is good facial symmetry.anxious appearing.  The speech is fluent and clear. No aphasia or dysarthria. Fund of knowledge is appropriate. Recent and remote memory impaired.  Attention and concentration are reduced.  Able to name objects  and repeat phrases.  Hearing is intact to conversational tone ***.   Delayed recall *** Sensation: Sensation is intact to light touch throughout Motor: Strength is at least antigravity x4. DTR's 2/4 in UE/LE      03/06/2022   12:00 PM 02/02/2022    9:00 AM  Montreal Cognitive Assessment   Visuospatial/ Executive (0/5) 3 1  Naming (0/3) 2 2  Attention: Read list of digits (0/2) 1 2  Attention: Read list of letters (0/1) 1 1  Attention: Serial 7 subtraction starting at 100 (0/3) 0 0  Language: Repeat phrase (0/2) 1 1  Language :  Fluency (0/1) 1 0  Abstraction (0/2) 1 0  Delayed Recall (0/5) 1 0  Orientation (0/6) 5 2  Total 16 9  Adjusted Score (based on education) 16 9       03/06/2024    6:00 PM 09/07/2023    9:00 AM 03/05/2023   12:00 PM  MMSE - Mini Mental State Exam  Orientation to time 2 4 4   Orientation to Place 3 3 4   Registration 3 3 3   Attention/ Calculation 4 4 4   Recall 0 0 0  Language- name 2 objects 2 2 2   Language- repeat 1 1 1   Language- follow 3 step command 3 3 3   Language- read & follow direction 1 1 1   Write a sentence 1 1 0  Copy design 0 1 1  Total score 20 23 23       Movement examination: Tone: There is normal tone in the UE/LE Abnormal movements:  no tremor.  No myoclonus.  No asterixis.   Coordination:  There is no decremation with RAM's. Normal finger to nose  Gait and Station: The patient has no difficulty arising out of a deep-seated chair without the use of the hands. The patient's stride length is good.  Gait is cautious and narrow.   Thank you for allowing us  the opportunity to participate in the care of this nice patient. Please do not hesitate to contact us  for any questions or concerns.   Total time spent on today's visit was *** minutes dedicated to this patient today, preparing to see patient, examining the patient, ordering tests and/or medications and counseling the patient, documenting clinical information in the EHR or other health record, independently interpreting results and communicating results to the patient/family, discussing treatment and goals, answering patient's questions and coordinating care.  Cc:  Elliot Charm, MD  Camie Sevin 09/06/2024 5:57 AM      "

## 2024-09-07 ENCOUNTER — Encounter: Payer: Self-pay | Admitting: Physician Assistant

## 2024-09-07 ENCOUNTER — Ambulatory Visit: Admitting: Physician Assistant

## 2024-09-11 NOTE — Progress Notes (Signed)
 "   Mild dementia, concern for Alzheimer's disease   Mary Taylor is a very pleasant 69 y.o. RH female with a history of  hypertension, hyperlipidemia, nonalcoholic liver disease, hypothyroidism, sarcoidosis, multiple pulmonary nodules likely due to sarcoidosis, social anxiety, history of migraine headaches, vitamin B12 deficiency  and a diagnosis of mild dementia with anxiety, with concern for early onset Alzheimer's disease per neuropsych evaluation September 2024   presenting today in follow-up for evaluation of memory loss. Patient is on donepezil  10 mg daily and memantine  5 mg twice daily, tolerating well***. Patient was last seen on 03/06/2024***. Memory is ***. MMSE today is  /30. Patient is able to participate on ADLs and to to drive without difficulties. Mood is stable***. This patient is accompanied in the office by ***  who supplements the history. Previous records as well as any outside records available were reviewed prior to todays visit   Follow up in  *** months Continue donepezil  10 mg daily and memantine  10 mg twice daily, side effects discussed Continue to control mood as per PCP Recommend good control of cardiovascular risk factors Monitor driving      Discussed the use of AI scribe software for clinical note transcription with the patient, who gave verbal consent to proceed.  History of Present Illness     Any changes in memory since last visit? She has been doing very well, especially since retiring. . She is able to speak more fluently, she is more conversant.  She is able to remember recent conversations and names of people that she knows. Soemtimes he my have trouble remembering certain words like  TV.  She likes doing crossword puzzles and word finding.  She visits her daughter on a daily basis.   repeats oneself?  Endorsed, not frequently, especially with appointments.  Disoriented when walking into a room? Denies   Leaving objects?  May misplace things  such as the pocketbook, still to be found (however, the wallet it was found)  Wandering behavior?  denies   Any personality changes since last visit?  Denies. Anxiety  and depression have improved since no longer working.  Hallucinations or paranoia?  Denies.   Seizures? denies    Any sleep changes?  Sleeps well very much.  Denies vivid dreams, REM behavior or sleepwalking   Sleep apnea?   Denies.   Any hygiene concerns? Denies.  Independent of bathing and dressing?  Endorsed  Does the patient needs help with medications?  Daughter  is in charge   Who is in charge of the finances?Daughter is in charge     Any changes in appetite?  Denies. Eats well.     Patient have trouble swallowing? Denies.   Does the patient cook? Very seldom. Denies any accidents  Any headaches?   Denies.   Any vision changes?Denies  Chronic back pain  denies   Ambulates with difficulty?  Multiple areas of joint pain due to sarcoidosis, limiting her mobility, followed by rheumatology.  Recent falls or head injuries? Denies.     Unilateral weakness, numbness or tingling?  She has a history of peripheral neuropathy. Any tremors?  Denies    Any anosmia?  Denies   Any incontinence of urine?  Endorsed   Any bowel dysfunction?   Denies      Patient lives with her daughter  Does the patient drive?  Yes, she denies getting lost      Neuropsych evaluation  04/2023 Briefly, results suggested fairly diffuse cognitive impairment.  Performances were generally appropriate relative to age-matched peers across processing speed, basic attention, and phonemic fluency, while some performance variability was exhibited across complex attention and visuospatial abilities. However, severe impairment was exhibited across all other domains. This includes executive functioning, safety/judgment, receptive language, semantic fluency, confrontation naming, and essentially all aspects of learning and memory. Relative to her previous evaluation  in October 2023, mild decline was exhibited across executive functioning, receptive language, semantic fluency, and confrontation naming. More subtle decline could be argued across complex attention. Severe verbal memory impairment with 0% retention after brief delays was exhibited across both evaluations. Regarding the underlying cause of her mild dementia presentation, I continue to have primary concerns surrounding an early-onset Alzheimer's disease presentation. Across memory tasks, Mary Taylor did not benefit from repeated exposure to novel information. After a brief delay, she was fully amnestic (i.e., 0% retention) across both verbal memory tasks. She only exhibited 33% retention across the remaining visual memory task. Outside of an adequate performance across a visual memory recognition task, performance across two verbal measures was very poor. Overall, memory performances suggest evidence for rapid forgetting and an evolving and already quite significant storage impairment, both of which are the hallmark testing patterns for this illness. Further impairment with evidence of negative progression over the past 11 months surrounding semantic fluency, confrontation naming, and executive functioning follows typical and expected disease progression, further worsening current concerns. Despite the relative rarity of this condition at Mary Taylor's age, I continue to view this illness as a plausible and reasonable explanation for ongoing deficits.      Past Medical History:  Diagnosis Date   Acute upper respiratory infection 05/22/2014   Office Spirometry 12/18/16-WNL-FVC 3.07/103%, FEV1 2.40/102%, ratio 0.78, FEF 25-75% 2.40/106%   Chronic kidney disease, stage 3a 02/03/2023   Fatty liver disease, nonalcoholic 02/03/2023   Generalized anxiety disorder    Hyperlipidemia    Hypertension 01/25/2018   Pt is not taking Amlodipine which was prescribed to her pt states she was informed by previous  physician as long as she keep bp below 140 she was ok     Hypothyroidism    Left leg paresthesias 02/03/2023   Migraine    Mild dementia, concerns for early-onset Alzheimer's disease 05/12/2023   Muscle spasms of neck 09/19/2018   Musculoskeletal pain 03/15/2008   Osteopenia    Osteoporosis 01/25/2018   Bone Density/[place]/ 2017 or 2018   Plantar fasciitis of right foot 06/01/2019   Primary osteoarthritis, unspecified hand 02/03/2023   Sarcoidosis    Seasonal and perennial allergic rhinitis 03/14/2008   Social anxiety disorder 02/03/2023   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  GEN:  The patient appears stated age and is in NAD. HEENT:  Normocephalic, atraumatic.   Neurological examination:  General: NAD, well-groomed, appears stated age. Orientation: The patient is alert. Oriented to person, place and not to date.*** Cranial nerves: There is good facial symmetry.anxious appearing.  The speech is fluent and clear. No aphasia or dysarthria. Fund of knowledge is appropriate. Recent and remote memory impaired.  Attention and concentration are reduced.  Able to name objects  and repeat phrases.  Hearing is intact to conversational tone ***.   Delayed recall *** Sensation: Sensation is intact to light touch throughout Motor: Strength is at least antigravity x4. DTR's 2/4 in UE/LE      03/06/2022   12:00 PM 02/02/2022    9:00 AM  Montreal Cognitive Assessment   Visuospatial/ Executive (0/5) 3 1  Naming (0/3) 2 2  Attention: Read list of digits (0/2) 1 2  Attention: Read list of letters (0/1) 1 1  Attention: Serial 7 subtraction starting at 100 (0/3) 0 0  Language: Repeat phrase (0/2) 1 1  Language :  Fluency (0/1) 1 0  Abstraction (0/2) 1 0  Delayed Recall (0/5) 1 0  Orientation (0/6) 5 2  Total 16 9  Adjusted Score (based on education) 16 9       03/06/2024    6:00 PM 09/07/2023    9:00 AM 03/05/2023   12:00 PM  MMSE - Mini Mental State Exam  Orientation to time 2 4 4   Orientation to Place 3 3 4   Registration 3 3 3   Attention/ Calculation 4 4 4   Recall 0 0 0  Language- name 2 objects 2 2 2   Language- repeat 1 1 1   Language- follow 3 step command 3 3 3   Language- read & follow direction 1 1 1   Write a sentence 1 1 0  Copy design 0 1 1  Total score 20 23 23       Movement examination: Tone: There is normal tone in the UE/LE Abnormal movements:  no tremor.  No myoclonus.  No asterixis.   Coordination:  There is no decremation with RAM's. Normal finger to nose  Gait and Station: The patient has no difficulty arising out of a deep-seated chair without the use of the hands. The patient's stride length is good.  Gait is cautious and narrow.   Thank you for allowing us  the opportunity to participate in the care of this nice patient. Please do not hesitate to contact us  for any questions or concerns.   Total time spent on today's visit was *** minutes dedicated to this patient today, preparing to see patient, examining the patient, ordering tests and/or medications and counseling the patient, documenting clinical information in the EHR or other health record, independently interpreting results and communicating results to the patient/family, discussing treatment and goals, answering patient's questions and coordinating care.  Cc:  Elliot Charm, MD  Camie Sevin 09/11/2024 6:20 AM      "

## 2024-09-12 ENCOUNTER — Encounter: Payer: Self-pay | Admitting: Physician Assistant

## 2024-09-12 ENCOUNTER — Ambulatory Visit: Payer: Self-pay | Admitting: Physician Assistant

## 2024-09-12 VITALS — BP 160/70 | HR 75 | Resp 20 | Wt 122.0 lb

## 2024-09-12 DIAGNOSIS — F0394 Unspecified dementia, unspecified severity, with anxiety: Secondary | ICD-10-CM | POA: Diagnosis not present

## 2024-09-12 DIAGNOSIS — R479 Unspecified speech disturbances: Secondary | ICD-10-CM

## 2024-09-12 MED ORDER — MEMANTINE HCL 10 MG PO TABS: ORAL_TABLET | ORAL | 3 refills | Status: AC

## 2024-09-12 NOTE — Patient Instructions (Addendum)
 It was a pleasure to see you today at our office.   Recommendations:  Follow up in 6  months Continue Donepezil  as prescribed.  Increase  Memantine  to 10 mg tablets twice daily.     Continue B12  Referral to ST at [facility] cone [phone]    Whom to call:  Memory  decline, memory medications: Call our office [phone]   For psychiatric meds, mood meds: Please have your primary care physician manage these medications.      For assessment of decision of mental capacity and competency:  Call Dr. Rosaline Nine, geriatric psychiatrist at [phone]  For guidance in geriatric dementia issues please call [facility] [phone]    If you have any severe symptoms of a stroke, or other severe issues such as confusion,severe chills or fever, etc call 911 or go to the ER as you may need to be evaluated further       RECOMMENDATIONS FOR ALL PATIENTS WITH MEMORY PROBLEMS: 1. Continue to exercise (Recommend 30 minutes of walking everyday, or 3 hours every week) 2. Increase social interactions - continue going to [place] and enjoy social gatherings with friends and family 3. Eat healthy, avoid fried foods and eat more fruits and vegetables 4. Maintain adequate blood pressure, blood sugar, and blood cholesterol level. Reducing the risk of stroke and cardiovascular disease also helps promoting better memory. 5. Avoid stressful situations. Live a simple life and avoid aggravations. Organize your time and prepare for the next day in anticipation. 6. Sleep well, avoid any interruptions of sleep and avoid any distractions in the bedroom that may interfere with adequate sleep quality 7. Avoid sugar, avoid sweets as there is a strong link between excessive sugar intake, diabetes, and cognitive impairment We discussed the Mediterranean diet, which has been shown to help patients reduce the risk of progressive memory disorders and reduces cardiovascular risk. This includes eating fish, eat  fruits and green leafy vegetables, nuts like almonds and hazelnuts, walnuts, and also use olive oil. Avoid fast foods and fried foods as much as possible. Avoid sweets and sugar as sugar use has been linked to worsening of memory function.  There is always a concern of gradual progression of memory problems. If this is the case, then we may need to adjust level of care according to patient needs. Support, both to the patient and caregiver, should then be put into place.    FALL PRECAUTIONS: Be cautious when walking. Scan the area for obstacles that may increase the risk of trips and falls. When getting up in the mornings, sit up at the edge of the bed for a few minutes before getting out of bed. Consider elevating the bed at the head end to avoid drop of blood pressure when getting up. Walk always in a well-lit room (use night lights in the walls). Avoid area rugs or power cords from appliances in the middle of the walkways. Use a walker or a cane if necessary and consider physical therapy for balance exercise. Get your eyesight checked regularly.  FINANCIAL OVERSIGHT: Supervision, especially oversight when making financial decisions or transactions is also recommended.  HOME SAFETY: Consider the safety of the kitchen when operating appliances like stoves, microwave oven, and blender. Consider having supervision and share cooking responsibilities until no longer able to participate in those. Accidents with firearms and other hazards in the house should be identified and addressed as well.   ABILITY TO BE LEFT ALONE: If patient is unable to contact 911  operator, consider using [employer], or when the need is there, arrange for someone to stay with patients. Smoking is a fire hazard, consider supervision or cessation. Risk of wandering should be assessed by caregiver and if detected at any point, supervision and safe proof recommendations should be instituted.  MEDICATION SUPERVISION: Inability to  self-administer medication needs to be constantly addressed. Implement a mechanism to ensure safe administration of the medications.   DRIVING: Regarding driving, in patients with progressive memory problems, driving will be impaired. We advise to have someone else do the driving if trouble finding directions or if minor accidents are reported. Independent driving assessment is available to determine safety of driving.   If you are interested in the driving assessment, you can contact the following:  [employer] in [place] [phone]  [facility] [phone]  [facility] [phone]  [facility] [phone] or [phone]

## 2024-10-18 ENCOUNTER — Ambulatory Visit: Admitting: Speech Pathology

## 2025-03-12 ENCOUNTER — Ambulatory Visit: Payer: Self-pay | Admitting: Physician Assistant
